# Patient Record
Sex: Male | Born: 1955 | Race: Black or African American | Hispanic: No | Marital: Single | Smoking: Current every day smoker
Health system: Southern US, Community
[De-identification: ages and names within clinical notes are randomized; demographics above are authoritative.]

## PROBLEM LIST (undated history)

## (undated) DIAGNOSIS — F1990 Other psychoactive substance use, unspecified, uncomplicated: Secondary | ICD-10-CM

## (undated) DIAGNOSIS — E7800 Pure hypercholesterolemia, unspecified: Secondary | ICD-10-CM

## (undated) DIAGNOSIS — I639 Cerebral infarction, unspecified: Secondary | ICD-10-CM

## (undated) DIAGNOSIS — I10 Essential (primary) hypertension: Secondary | ICD-10-CM

## (undated) DIAGNOSIS — F1010 Alcohol abuse, uncomplicated: Secondary | ICD-10-CM

## (undated) HISTORY — PX: NO PAST SURGERIES: SHX2092

---

## 2003-10-22 ENCOUNTER — Other Ambulatory Visit: Payer: Self-pay

## 2004-04-30 ENCOUNTER — Ambulatory Visit: Payer: Self-pay | Admitting: Unknown Physician Specialty

## 2010-07-27 ENCOUNTER — Ambulatory Visit: Payer: Self-pay

## 2013-09-02 ENCOUNTER — Inpatient Hospital Stay: Payer: Self-pay | Admitting: Internal Medicine

## 2013-09-02 ENCOUNTER — Ambulatory Visit: Payer: Self-pay | Admitting: Neurology

## 2013-09-02 DIAGNOSIS — I369 Nonrheumatic tricuspid valve disorder, unspecified: Secondary | ICD-10-CM

## 2013-09-02 LAB — MAGNESIUM: Magnesium: 1.9 mg/dL

## 2013-09-02 LAB — CBC
HCT: 53.8 % — ABNORMAL HIGH (ref 40.0–52.0)
HGB: 18 g/dL (ref 13.0–18.0)
MCH: 32.4 pg (ref 26.0–34.0)
MCHC: 33.6 g/dL (ref 32.0–36.0)
MCV: 97 fL (ref 80–100)
Platelet: 273 10*3/uL (ref 150–440)
RBC: 5.57 10*6/uL (ref 4.40–5.90)
RDW: 15.2 % — ABNORMAL HIGH (ref 11.5–14.5)
WBC: 11.5 10*3/uL — ABNORMAL HIGH (ref 3.8–10.6)

## 2013-09-02 LAB — DRUG SCREEN, URINE
Amphetamines, Ur Screen: NEGATIVE (ref ?–1000)
Barbiturates, Ur Screen: NEGATIVE (ref ?–200)
Benzodiazepine, Ur Scrn: NEGATIVE (ref ?–200)
Cannabinoid 50 Ng, Ur ~~LOC~~: POSITIVE (ref ?–50)
Cocaine Metabolite,Ur ~~LOC~~: NEGATIVE (ref ?–300)
MDMA (Ecstasy)Ur Screen: NEGATIVE (ref ?–500)
Methadone, Ur Screen: NEGATIVE (ref ?–300)
Opiate, Ur Screen: NEGATIVE (ref ?–300)
Phencyclidine (PCP) Ur S: NEGATIVE (ref ?–25)
TRICYCLIC, UR SCREEN: NEGATIVE (ref ?–1000)

## 2013-09-02 LAB — COMPREHENSIVE METABOLIC PANEL
ALBUMIN: 3.7 g/dL (ref 3.4–5.0)
ALK PHOS: 88 U/L
AST: 89 U/L — AB (ref 15–37)
Anion Gap: 8 (ref 7–16)
BUN: 7 mg/dL (ref 7–18)
Bilirubin,Total: 0.6 mg/dL (ref 0.2–1.0)
CALCIUM: 9 mg/dL (ref 8.5–10.1)
Chloride: 102 mmol/L (ref 98–107)
Co2: 21 mmol/L (ref 21–32)
Creatinine: 1.33 mg/dL — ABNORMAL HIGH (ref 0.60–1.30)
EGFR (African American): >60
EGFR (Non-African Amer.): 59 — ABNORMAL LOW
GLUCOSE: 109 mg/dL — AB (ref 65–99)
Osmolality: 261 (ref 275–301)
Potassium: 3.7 mmol/L (ref 3.5–5.1)
SGPT (ALT): 102 U/L — ABNORMAL HIGH (ref 12–78)
Sodium: 131 mmol/L — ABNORMAL LOW (ref 136–145)
TOTAL PROTEIN: 8.3 g/dL — AB (ref 6.4–8.2)

## 2013-09-02 LAB — TROPONIN I

## 2013-09-02 LAB — TSH: Thyroid Stimulating Horm: 5.51 u[IU]/mL — ABNORMAL HIGH

## 2013-09-02 LAB — PHOSPHORUS: Phosphorus: 2.3 mg/dL — ABNORMAL LOW (ref 2.5–4.9)

## 2013-09-02 LAB — ETHANOL

## 2013-09-02 LAB — LIPID PANEL
CHOLESTEROL: 157 mg/dL (ref 0–200)
HDL: 47 mg/dL (ref 40–60)
Ldl Cholesterol, Calc: 91 mg/dL (ref 0–100)
TRIGLYCERIDES: 96 mg/dL (ref 0–200)
VLDL CHOLESTEROL, CALC: 19 mg/dL (ref 5–40)

## 2013-09-02 LAB — APTT: Activated PTT: 33 secs (ref 23.6–35.9)

## 2013-09-03 LAB — BASIC METABOLIC PANEL
Anion Gap: 12 (ref 7–16)
BUN: 10 mg/dL (ref 7–18)
CHLORIDE: 99 mmol/L (ref 98–107)
CREATININE: 1.22 mg/dL (ref 0.60–1.30)
Calcium, Total: 9.5 mg/dL (ref 8.5–10.1)
Co2: 20 mmol/L — ABNORMAL LOW (ref 21–32)
EGFR (Non-African Amer.): >60
GLUCOSE: 90 mg/dL (ref 65–99)
Osmolality: 261 (ref 275–301)
Potassium: 4.3 mmol/L (ref 3.5–5.1)
Sodium: 131 mmol/L — ABNORMAL LOW (ref 136–145)

## 2013-09-03 LAB — MAGNESIUM: MAGNESIUM: 2.3 mg/dL

## 2013-09-05 LAB — BASIC METABOLIC PANEL
Anion Gap: 7 (ref 7–16)
BUN: 17 mg/dL (ref 7–18)
CALCIUM: 9.2 mg/dL (ref 8.5–10.1)
CO2: 26 mmol/L (ref 21–32)
Chloride: 100 mmol/L (ref 98–107)
Creatinine: 1.62 mg/dL — ABNORMAL HIGH (ref 0.60–1.30)
EGFR (African American): 54 — ABNORMAL LOW
EGFR (Non-African Amer.): 46 — ABNORMAL LOW
GLUCOSE: 87 mg/dL (ref 65–99)
Osmolality: 267 (ref 275–301)
Potassium: 4 mmol/L (ref 3.5–5.1)
Sodium: 133 mmol/L — ABNORMAL LOW (ref 136–145)

## 2013-09-06 LAB — BASIC METABOLIC PANEL
ANION GAP: 8 (ref 7–16)
BUN: 18 mg/dL (ref 7–18)
Calcium, Total: 8.9 mg/dL (ref 8.5–10.1)
Chloride: 103 mmol/L (ref 98–107)
Co2: 23 mmol/L (ref 21–32)
Creatinine: 1.37 mg/dL — ABNORMAL HIGH (ref 0.60–1.30)
EGFR (African American): >60
EGFR (Non-African Amer.): 57 — ABNORMAL LOW
GLUCOSE: 92 mg/dL (ref 65–99)
Osmolality: 270 (ref 275–301)
POTASSIUM: 3.9 mmol/L (ref 3.5–5.1)
Sodium: 134 mmol/L — ABNORMAL LOW (ref 136–145)

## 2014-02-27 ENCOUNTER — Ambulatory Visit: Payer: Self-pay

## 2014-07-13 NOTE — H&P (Signed)
PATIENT NAME:  Melvin Miles, Melvin Miles MR#:  [id number] DATE OF BIRTH:  11-03-1955  DATE OF ADMISSION:  09/02/2013  CHIEF COMPLAINT: Unsteady gait for 2 to 3 days.   HISTORY OF PRESENT ILLNESS: Melvin Miles is a 59 year old gentleman with no significant past medical history, comes in the Emergency Room after he started noticing very unsteady gait at home. He is having significant problem balancing and unable to walk around without any help. He has been helped at home by his brother. It happened all of a sudden about 2 to 3 days ago. Denies any fall or any injury. Denies any history of stroke in the past. He has received aspirin. In Emergency Room he was noted to have blood pressure of 212/102 in the Emergency Room on arrival. He received some IV labetalol. He is being admitted for uncontrolled hypertension/accelerated hypertension possible cerebrovascular accident, ataxia/unsteady gait. CT head showed cerebral atrophy. No acute stroke was identified.   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 No treatment was given and the patient is not following any neurologist at this time. He does not know any details about it.   FAMILY HISTORY: Positive for hypertension.   SOCIAL HISTORY: He smokes on a daily basis. Drinks a couple of beers every day. He denies any history of DTs. Denies any other drug use.    MEDICATIONS: None.   REVIEW OF SYSTEMS:  CONSTITUTIONAL: No fever, fatigue, weakness.  EYES: No blurred or double vision, glaucoma or cataracts.  ENT: No tinnitus, ear pain, hearing loss.  RESPIRATORY: No cough, wheeze, hemoptysis, shortness of breath.  CARDIOVASCULAR: No chest pain, orthopnea, edema.  GASTROINTESTINAL: No nausea, vomiting, diarrhea, or abdominal pain.  GENITOURINARY: No dysuria or hematuria.  ENDOCRINE: No polyuria, nocturia or thyroid problems.   HEMATOLOGY: No anemia or easy bruising.  SKIN: No acne,  rash.  MUSCULOSKELETAL: Positive for back pain.  NEUROLOGIC: No CVA, transient ischemic attack, no dysarthria, dementia, or vertigo. Positive for unsteady gait and ataxia.  PSYCHIATRIC: No anxiety or depression. All other systems reviewed and negative.   PHYSICAL EXAMINATION: GENERAL: The patient is awake, alert, oriented x 3, not in acute distress, afebrile, pulse 52, blood pressure 178/84, sats are 98% on room air.  HEENT: Atraumatic, normocephalic. Pupils PERRLA, EOMI intact. Oral mucosa is moist.  NECK: Supple. No JVD. No carotid bruit.  LUNGS: Clear to auscultation bilaterally. No rales, rhonchi, respiratory distress or labored breathing.  HEART: Both the heart sounds are normal. Rate, rhythm regular. PMI not lateralized. Chest is nontender, good pedal pulses, good femoral pulses. No lower extremity edema.  ABDOMEN: Soft, benign, nontender. No organomegaly. Positive bowel sounds.  NEUROLOGIC: Grossly intact cranial nerves II through XII. No motor or sensory deficit. The patient does have ataxic gait. Positive Romberg sign.  The patient has difficulty with  test of coordination in both lower extremities. No nystagmus noted. Speech clear.  SKIN: Warm and dry.  PSYCHIATRIC: The patient is awake, alert, oriented x 3.   Ultrasound of the Doppler shows patent vertebral vessels. Mild atherosclerotic disease in the left internal carotid artery estimated degree of stenosis is less than 50% bilaterally. 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 Cortical lung wall prominence of ventricular sulci suggestive of mild cortical volume loss. Diffuse small vessel ischemic microangiopathy. CBC within normal limits. Carotid markers normal. Creatinine is 1.33. Glucose is 109, sodium  is 131. SGPT 102. SGOT is 89. Total protein is 8.3. EKG shows sinus rhythm. No acute ST elevation or depression.   ASSESSMENT AND PLAN: A 59 year old Melvin Miles with  no significant past medical history comes in with:  1. Unsteady gait and ataxia. The patient will be admitted on telemetry floor. Rule out cerebrovascular accident. He has history of cerebellar infarct with cerebellar atrophy in the past and ongoing history of alcohol use. We will continue aspirin for now.  Get ultrasound Doppler of carotid, echo of the  heart and MRI of the brain. Physical therapy to see the patient. Neuro consultation for further evaluation and assessment for unsteady gait/ataxia.  2. Uncontrolled/accelerated hypertension. The patient came in with blood pressure 221/102. Received IV labetalol. We will give him p.o. Toprol-XL and hydrochlorothiazide and put him on p.r.n. hydralazine and adjust blood pressure meds according to blood pressure  3. Elevated LFTs, likely secondary to alcohol abuse. Check serum alcohol level. The patient advised on alcohol abstinence. We will continue to monitor LFTs.  4. Clinical dehydration with elevated creatinine and low sodium. 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 We will give him some IV fluids and see if the labs  improve with that.  5. Deep vein thrombosis prophylaxis. Subcutaneous heparin.  6. Alcohol abuse. We will put him on CIWA protocol with IV Ativan.  7. Smoking cessation. Counseling done about 3 to 4 minutes spent. The patient was advised on smoking cessation. He did voice understanding.   Further work-up according to the patient's clinical course. Hospital admission plan was discussed with the patient, the patient's brother was present in the Emergency Room.   TIME SPENT: 55 minutes.    ____________________________ Hart Rochester Posey Pronto, MD sap:sg D: 09/02/2013 11:05:00 ET T: 09/02/2013 11:23:11 ET JOB#: [id number]  cc: Everly Rubalcava A. Posey Pronto, MD, <Dictator> Ilda Basset MD ELECTRONICALLY SIGNED 09/03/2013 14:21

## 2014-07-13 NOTE — Consult Note (Signed)
PATIENT NAME:  Melvin Miles, Melvin Miles MR#:  [id number] DATE OF BIRTH:  1955-05-10  DATE OF CONSULTATION:  09/02/2013  REFERRING PHYSICIAN:   CONSULTING PHYSICIAN:  Leotis Pain, MD  REASON FOR CONSULTATION:  Ataxia.  HISTORY OF PRESENT ILLNESS: This is a 59 year old gentleman with past medical history of EtOH use that is daily. The patient states he drinks 40 of beer daily, presents with a 2 to 3-day history of ataxic gait. The patient has difficulty ambulating. No history of stroke in the past. On admission, the patient was found to be hypertensive with systolic blood pressure 578/469. The patient is admitted for further workup of unsteady gait. CT of the head did not show any acute intracranial abnormalities, it showed generalized atrophy consistent with chronic EtOH abuse.    PAST MEDICAL HISTORY: Questionable history of tumor at the age of 54, not seen on the CAT scan right now.   FAMILY HISTORY: Positive for hypertension.   SOCIAL HISTORY: Smokes daily and drinks at least 40 ounces a day. Denies any drug use.   MEDICATIONS: None.   LABORATORY, DIAGNOSTIC AND RADIOLOGICAL DATA:  Laboratory workup has been reviewed. CAT scan as described above.   REVIEW OF SYSTEMS:  CONSTITUTIONAL:  No fever. No fatigue.  EYES:  No double vision. No glaucoma no cataracts.  ENT:  No ear pain. No hearing loss.  RESPIRATORY:  No cough. No wheeze. No hemoptysis. No chest pain, no orthopnea or edema.   GASTROINTESTINAL:  No nausea, vomiting, diarrhea.  ENDOCRINE:  No polyuria or dysuria. Thyroid problems.  NEUROLOGICAL: Positive for ataxic gait.  PSYCHIATRIC: No anxiety or depression.   PHYSICAL EXAMINATION: VITAL SIGNS: Include a temperature of 97.4, blood pressure 167/87.  NEUROLOGIC: The patient was able to tell me his name and that he is in the hospital, could not tell me the date, time. Speech appears to be fluent but slow. No signs of dysarthria or aphasia noted. Remote memory is severely  impaired. Extraocular movements are intact. Visual fields appear to be intact but slower than I would expect for his age. Sensation intact. Facial motor intact. Tongue is midline. Uvula elevates symmetrically. Shoulder shrug intact. Motor strength appears to be 5/5 bilaterally upper and lower extremities. Coordination very tremulous and dysmetria bilaterally upper and lower extremities on heel-to-shin and on finger-to-nose.  Gait  attempted but could not be assessed due to ataxia.   IMPRESSION: This is a 59 year old gentleman with no real past medical history on file except chronic ethyl alcohol use daily, at least 40 ounces of beer, presenting with 3-day history of  ataxic gait. On further examination, it does appear the patient has confabulations and at times  visual fields are affected but no other ophthalmoplegia has been noted. On coronary  examination, his symptoms are more related towards Wernicke's encephalopathy rather than a stroke. Strokes should not produce severe dysmetria bilaterally, upper and lower extremities.   PLAN: The patient is on thiamine and his thiamine was changed from 50 mg daily to 500 mg IV daily. Please obtain vitamin B level to rule out pernicious anemia, which could also contribute to current symptoms. Continue thiamine 500 mg IV for 3 days, then switch it to 100 mg q.8 hours, which can be done p.o. after which, finishing the course of another 3 days, the patient should be on 100 mg daily. Continue the folic acid and multivitamin. I do not think this patient was taking vitamins or good nutrition.  It takes about 6 months to deplete the  thiamine stores. Imaging consistent with generalized atrophy of his brain, which could be consistent with chronic EtOH use. Do not think the patient needs an MRI at this point.   Thank you. It was a pleasure seeing this patient. Please call with any questions.  ____________________________ Leotis Pain, MD yz:cs D: 09/02/2013 15:54:47  ET T: 09/02/2013 18:29:53 ET JOB#: [id number]  cc: Leotis Pain, MD, <Dictator> Leotis Pain MD ELECTRONICALLY SIGNED 09/06/2013 17:21

## 2014-07-13 NOTE — Discharge Summary (Signed)
PATIENT NAME:  Melvin Miles, Melvin Miles MR#:  [id number] DATE OF BIRTH:  [birth year]  DATE OF ADMISSION:  09/02/2013 DATE OF DISCHARGE:    PRIMARY CARE PHYSICIAN: Dr. Lovie Macadamia.  FINAL DIAGNOSES: Acute cerebrovascular accident with ataxia, Wernicle encephalopathy, hypertension, alcohol abuse and chronic kidney disease.   CONDITION: Stable.   CODE STATUS: Full code.   HOME MEDICATIONS: Aspirin 81 mg p.o. daily; Zocor 20 mg p.o. at bedtime; Norvasc 5 mg p.o. daily and folic acid 1 mg p.o. daily.   DIET: Low sodium diet.   ACTIVITY: As tolerated.   FOLLOWUP CARE: Follow up with PCP within 1 to 2 weeks.   REASON FOR ADMISSION: Unsteady gait for 2 to 3 days.   HOSPITAL COURSE: The patient is a 59 year old, African American male with no past medical history, who came to ED for unsteady gait at home. He had significant problem balancing and unable to walk around without any help. The patient's blood pressure was high at 212/102 in the ED. He received IV labetalol and admitted for ataxia. The patient's CAT scan of head showed cerebral atrophy, but no acute stroke was identified. For detailed history and physical examination, please refer to the admission noted dictated by Dr. Fritzi Mandes.  For his unsteady gait and ataxia, after admission, the patient got an MRI of the brain, which showed acute lacunar infarct. Unsteady gait and ataxia is possibly due to acute CVA. The patient got  an aspirin and Zocor treatment. In addition, the patient got a physical therapy evaluation. The patient's echocardiogram did not show any source of CVA. No significant and carotid stenosis. According to Dr. Irish Elders, the patient may also have aWernicle encephalopathy. The patient was treated with thiamine and folic acid. According to physical therapy evaluation, the patient needs skilled nursing facility. The patient's blood pressure is stable. The patient still has ataxia. He will be discharged to skilled nursing facility today.  I discussed the patient's discharge plan with the patient, nurse and social worker.  Time spent: 37 minutes. ____________________________ Demetrios Loll, MD qc:aw D: 09/06/2013 11:19:14 ET T: 09/06/2013 11:30:23 ET JOB#: [id number]  cc: Demetrios Loll, MD, <Dictator> Demetrios Loll MD ELECTRONICALLY SIGNED 09/06/2013 12:32

## 2015-01-10 ENCOUNTER — Encounter: Payer: Self-pay | Admitting: *Deleted

## 2015-01-13 ENCOUNTER — Encounter: Admission: RE | Payer: Self-pay | Source: Ambulatory Visit

## 2015-01-13 ENCOUNTER — Ambulatory Visit
Admission: RE | Admit: 2015-01-13 | Payer: Medicaid Other | Source: Ambulatory Visit | Admitting: Unknown Physician Specialty

## 2015-01-13 HISTORY — DX: Essential (primary) hypertension: I10

## 2015-01-13 HISTORY — DX: Alcohol abuse, uncomplicated: F10.10

## 2015-01-13 HISTORY — DX: Cerebral infarction, unspecified: I63.9

## 2015-01-13 HISTORY — DX: Other psychoactive substance use, unspecified, uncomplicated: F19.90

## 2015-01-13 HISTORY — DX: Pure hypercholesterolemia, unspecified: E78.00

## 2015-01-13 SURGERY — COLONOSCOPY WITH PROPOFOL
Anesthesia: General

## 2015-02-21 ENCOUNTER — Ambulatory Visit: Admit: 2015-02-21 | Payer: Medicaid Other | Admitting: Unknown Physician Specialty

## 2015-02-21 SURGERY — COLONOSCOPY WITH PROPOFOL
Anesthesia: General

## 2015-03-05 ENCOUNTER — Encounter: Payer: Self-pay | Admitting: *Deleted

## 2015-04-09 ENCOUNTER — Encounter: Payer: Self-pay | Admitting: *Deleted

## 2015-04-09 ENCOUNTER — Ambulatory Visit: Payer: Medicaid Other | Admitting: Anesthesiology

## 2015-04-09 ENCOUNTER — Ambulatory Visit
Admission: RE | Admit: 2015-04-09 | Discharge: 2015-04-09 | Disposition: A | Discharge status: Home / Self Care | Payer: Medicaid Other | Source: Ambulatory Visit | Attending: Unknown Physician Specialty | Admitting: Unknown Physician Specialty

## 2015-04-09 ENCOUNTER — Encounter
Admission: RE | Disposition: A | Discharge status: Home / Self Care | Payer: Self-pay | Source: Ambulatory Visit | Attending: Unknown Physician Specialty

## 2015-04-09 DIAGNOSIS — K640 First degree hemorrhoids: Secondary | ICD-10-CM | POA: Diagnosis not present

## 2015-04-09 DIAGNOSIS — F1010 Alcohol abuse, uncomplicated: Secondary | ICD-10-CM | POA: Insufficient documentation

## 2015-04-09 DIAGNOSIS — F17200 Nicotine dependence, unspecified, uncomplicated: Secondary | ICD-10-CM | POA: Insufficient documentation

## 2015-04-09 DIAGNOSIS — D123 Benign neoplasm of transverse colon: Secondary | ICD-10-CM | POA: Insufficient documentation

## 2015-04-09 DIAGNOSIS — I10 Essential (primary) hypertension: Secondary | ICD-10-CM | POA: Insufficient documentation

## 2015-04-09 DIAGNOSIS — Z8371 Family history of colonic polyps: Secondary | ICD-10-CM | POA: Diagnosis not present

## 2015-04-09 DIAGNOSIS — Z7982 Long term (current) use of aspirin: Secondary | ICD-10-CM | POA: Diagnosis not present

## 2015-04-09 DIAGNOSIS — D125 Benign neoplasm of sigmoid colon: Secondary | ICD-10-CM | POA: Diagnosis not present

## 2015-04-09 DIAGNOSIS — Z79899 Other long term (current) drug therapy: Secondary | ICD-10-CM | POA: Insufficient documentation

## 2015-04-09 DIAGNOSIS — D122 Benign neoplasm of ascending colon: Secondary | ICD-10-CM | POA: Diagnosis not present

## 2015-04-09 DIAGNOSIS — K621 Rectal polyp: Secondary | ICD-10-CM | POA: Insufficient documentation

## 2015-04-09 DIAGNOSIS — Z1211 Encounter for screening for malignant neoplasm of colon: Secondary | ICD-10-CM | POA: Diagnosis not present

## 2015-04-09 DIAGNOSIS — E7800 Pure hypercholesterolemia, unspecified: Secondary | ICD-10-CM | POA: Diagnosis not present

## 2015-04-09 DIAGNOSIS — Z800 Family history of malignant neoplasm of digestive organs: Secondary | ICD-10-CM | POA: Diagnosis not present

## 2015-04-09 DIAGNOSIS — Z8673 Personal history of transient ischemic attack (TIA), and cerebral infarction without residual deficits: Secondary | ICD-10-CM | POA: Diagnosis not present

## 2015-04-09 HISTORY — PX: COLONOSCOPY WITH PROPOFOL: SHX5780

## 2015-04-09 LAB — HM COLONOSCOPY

## 2015-04-09 SURGERY — COLONOSCOPY WITH PROPOFOL
Anesthesia: General

## 2015-04-09 MED ORDER — PHENYLEPHRINE HCL 10 MG/ML IJ SOLN
INTRAMUSCULAR | Status: DC | PRN
  Administered 2015-04-09 (×2): 100 ug via INTRAVENOUS

## 2015-04-09 MED ORDER — SODIUM CHLORIDE 0.9 % IV SOLN
INTRAVENOUS | Status: DC
  Administered 2015-04-09: 09:00:00 via INTRAVENOUS

## 2015-04-09 MED ORDER — SODIUM CHLORIDE 0.9 % IV SOLN: INTRAVENOUS | Status: DC

## 2015-04-09 MED ORDER — PROPOFOL 10 MG/ML IV BOLUS
INTRAVENOUS | Status: DC | PRN
  Administered 2015-04-09 (×14): 20 mg via INTRAVENOUS

## 2015-04-09 NOTE — H&P (Signed)
   Primary Care Physician:  Loistine Chance, MD Primary Gastroenterologist:  Dr. Vira Agar  Pre-Procedure History & Physical: HPI:  Melvin Miles is a 60 y.o. male is here for an colonoscopy.   Past Medical History  Diagnosis Date  . Alcohol abuse   . Drug use   . Hypertension   . Stroke ([place])   . Hypercholesterolemia     Past Surgical History  Procedure Laterality Date  . No past surgeries      Prior to Admission medications   Medication Sig Start Date End Date Taking? Authorizing Provider  amLODipine (NORVASC) 10 MG tablet Take 10 mg by mouth daily.    Historical Provider, MD  aspirin 81 MG tablet Take 81 mg by mouth daily.    Historical Provider, MD  atorvastatin (LIPITOR) 80 MG tablet Take 80 mg by mouth daily.    Historical Provider, MD  folic acid (FOLVITE) 1 MG tablet Take 1 mg by mouth daily.    Historical Provider, MD  lisinopril (PRINIVIL,ZESTRIL) 20 MG tablet Take 20 mg by mouth daily.    Historical Provider, MD  oxyCODONE (ROXICODONE) 15 MG immediate release tablet Take 15 mg by mouth every 4 (four) hours as needed for pain.    Historical Provider, MD    Allergies as of 02/20/2015  . (No Known Allergies)    History reviewed. No pertinent family history.  Social History   Social History  . Marital Status: Single    Spouse Name: N/A  . Number of Children: N/A  . Years of Education: N/A   Occupational History  . Not on file.   Social History Main Topics  . Smoking status: Current Every Day Smoker  . Smokeless tobacco: Not on file  . Alcohol Use: Not on file  . Drug Use: Not on file  . Sexual Activity: Not on file   Other Topics Concern  . Not on file   Social History Narrative    Review of Systems: See HPI, otherwise negative ROS  Physical Exam: BP 111/71 mmHg  Pulse 82  Temp(Src) 97.9 F (36.6 C) (Tympanic)  Resp 16  Ht 5\' 4"  (1.626 m)  Wt 63.504 kg (140 lb)  BMI 24.02 kg/m2  SpO2 99% General:   Alert,  pleasant and cooperative  in NAD Head:  Normocephalic and atraumatic. Neck:  Supple; no masses or thyromegaly. Lungs:  Clear throughout to auscultation.    Heart:  Regular rate and rhythm. Abdomen:  Soft, nontender and nondistended. Normal bowel sounds, without guarding, and without rebound.   Neurologic:  Alert and  oriented x4;  grossly normal neurologically.  Impression/Plan: Melvin Miles is here for an colonoscopy to be performed for screening due to [place] colon cancer.  Risks, benefits, limitations, and alternatives regarding  colonoscopy have been reviewed with the patient.  Questions have been answered.  All parties agreeable.   [name], MD  04/09/2015, 8:44 AM

## 2015-04-09 NOTE — Op Note (Signed)
[facility] Gastroenterology Patient Name: Melvin Miles Procedure Date: 04/09/2015 8:51 AM MRN: [id number] Account #: [id number] Date of Birth: 06-16-1955 Admit Type: Outpatient Age: 60 Room: [facility] ENDO ROOM 4 Gender: Male Note Status: Finalized Procedure:         Colonoscopy Indications:       Screening in patient at increased risk: Family history of                     1st-degree relative with colorectal cancer Providers:         Manya Silvas, MD Referring MD:      Bethena Roys. Sowles, MD (Referring MD) Medicines:         Propofol per Anesthesia Complications:     No immediate complications. Procedure:         Pre-Anesthesia Assessment:                    - After reviewing the risks and benefits, the patient was                     deemed in satisfactory condition to undergo the procedure.                    After obtaining informed consent, the colonoscope was                     passed under direct vision. Throughout the procedure, the                     patient's blood pressure, pulse, and oxygen saturations                     were monitored continuously. The Olympus PCF-H180AL                     colonoscope ( S#: [id number] ) was introduced through the                     anus and advanced to the the cecum, identified by                     appendiceal orifice and ileocecal valve. The colonoscopy                     was performed without difficulty. The patient tolerated                     the procedure well. The quality of the bowel preparation                     was excellent. Findings:      Multiple sessile polyps were found in the rectum, in the sigmoid colon,       in the transverse colon and in the ascending colon. The polyps were       small in size. These polyps were removed with a hot snare. Resection and       retrieval were complete. One clip placed in ascending colon site. One       polyp removed with cold forceps.      Internal  hemorrhoids were found during endoscopy. The hemorrhoids were       small and Grade I (internal hemorrhoids that do not prolapse). Impression:        - Multiple small polyps in  the rectum, in the sigmoid                     colon, in the transverse colon and in the ascending colon.                     Resected and retrieved.                    - Internal hemorrhoids. Recommendation:    - Await pathology results. Manya Silvas, MD 04/09/2015 9:35:33 AM This report has been signed electronically. Number of Addenda: 0 Note Initiated On: 04/09/2015 8:51 AM Scope Withdrawal Time: 0 hours 20 minutes 4 seconds  Total Procedure Duration: 0 hours 32 minutes 2 seconds       [facility]

## 2015-04-09 NOTE — Transfer of Care (Signed)
Immediate Anesthesia Transfer of Care Note  Patient: Melvin Miles  Procedure(s) Performed: Procedure(s): COLONOSCOPY WITH PROPOFOL (N/A)  Patient Location: PACU and Endoscopy Unit  Anesthesia Type:General  Level of Consciousness: alert  and oriented  Airway & Oxygen Therapy: Patient Spontanous Breathing and Patient connected to nasal cannula oxygen  Post-op Assessment: Report given to RN and Post -op Vital signs reviewed and stable  Post vital signs: stable  Last Vitals:  Filed Vitals:   04/09/15 0927 04/09/15 0928  BP:    Pulse: 77 81  Temp:    Resp: 0 0    Complications: No apparent anesthesia complications

## 2015-04-09 NOTE — Anesthesia Preprocedure Evaluation (Signed)
Anesthesia Evaluation  Patient identified by MRN, date of birth, ID band Patient awake    Reviewed: Allergy & Precautions, H&P , NPO status , Patient's Chart, lab work & pertinent test results, reviewed documented beta blocker date and time   Airway Mallampati: II   Neck ROM: full    Dental  (+) Poor Dentition   Pulmonary neg pulmonary ROS, Current Smoker,    Pulmonary exam normal        Cardiovascular hypertension, negative cardio ROS Normal cardiovascular exam     Neuro/Psych CVA, Residual Symptoms negative neurological ROS  negative psych ROS   GI/Hepatic negative GI ROS, Neg liver ROS,   Endo/Other  negative endocrine ROS  Renal/GU negative Renal ROS  negative genitourinary   Musculoskeletal   Abdominal   Peds  Hematology negative hematology ROS (+)   Anesthesia Other Findings Past Medical History:   Alcohol abuse                                                Drug use                                                     Hypertension                                                 Stroke ([place])                                                 Hypercholesterolemia                                       Past Surgical History:   NO PAST SURGERIES                                           BMI    Body Mass Index   24.01 kg/m 2     Reproductive/Obstetrics                             Anesthesia Physical Anesthesia Plan  ASA: III  Anesthesia Plan: General   Post-op Pain Management:    Induction:   Airway Management Planned:   Additional Equipment:   Intra-op Plan:   Post-operative Plan:   Informed Consent: I have reviewed the patients History and Physical, chart, labs and discussed the procedure including the risks, benefits and alternatives for the proposed anesthesia with the patient or authorized representative who has indicated his/her understanding and acceptance.   Dental  Advisory Given  Plan Discussed with: CRNA  Anesthesia Plan Comments:         Anesthesia Quick Evaluation

## 2015-04-09 NOTE — Anesthesia Postprocedure Evaluation (Signed)
Anesthesia Post Note  Patient: Melvin Miles  Procedure(s) Performed: Procedure(s) (LRB): COLONOSCOPY WITH PROPOFOL (N/A)  Patient location during evaluation: PACU Anesthesia Type: General Level of consciousness: awake and alert Pain management: pain level controlled Vital Signs Assessment: post-procedure vital signs reviewed and stable Respiratory status: spontaneous breathing, nonlabored ventilation, respiratory function stable and patient connected to nasal cannula oxygen Cardiovascular status: blood pressure returned to baseline and stable Postop Assessment: no signs of nausea or vomiting Anesthetic complications: no    Last Vitals:  Filed Vitals:   04/09/15 0927 04/09/15 0928  BP:    Pulse: 77 81  Temp:    Resp: 0 0    Last Pain: There were no vitals filed for this visit.               Molli Barrows

## 2015-04-11 LAB — SURGICAL PATHOLOGY

## 2015-07-02 IMAGING — CT CT HEAD WITHOUT CONTRAST
1 series · 15 of 30 positions shown, 19 images · non-contrast
Comparison: None.

CLINICAL DATA: Sudden onset of weakness and loss of balance. Right
arm drift and perseveration of speech.

EXAM:
CT HEAD WITHOUT CONTRAST
TECHNIQUE: Contiguous axial images were obtained from the base of the skull
through the vertex without intravenous contrast.

[Series 2: head wo · axial · 0.39mm/px · z∈[-21,+105]mm · 15 of 32 slices shown, 19 images]
[im 2/32  brain]
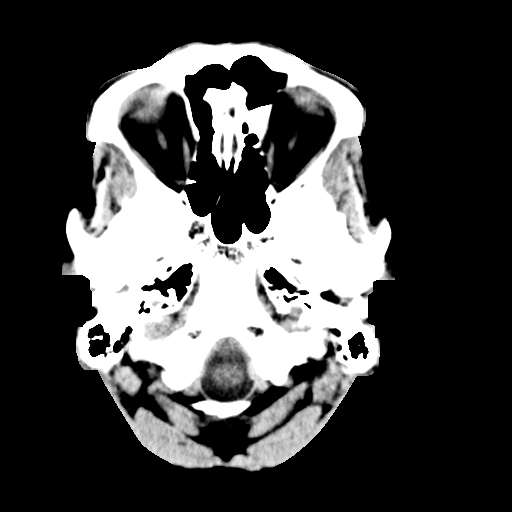
[im 2/32  bone]
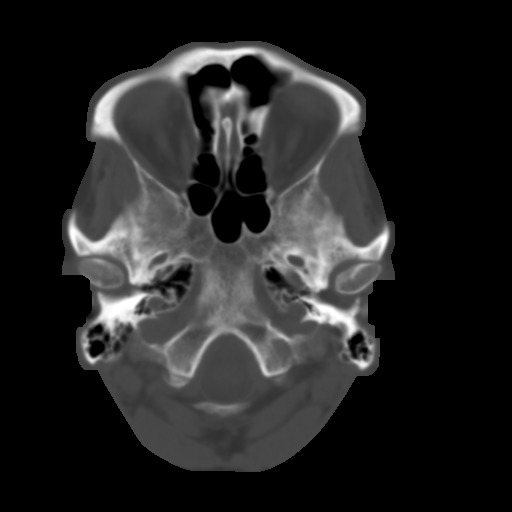
[im 4/32  brain]
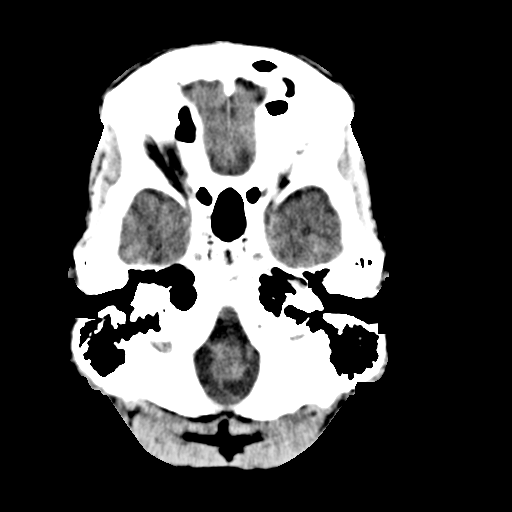
[im 6/32  brain]
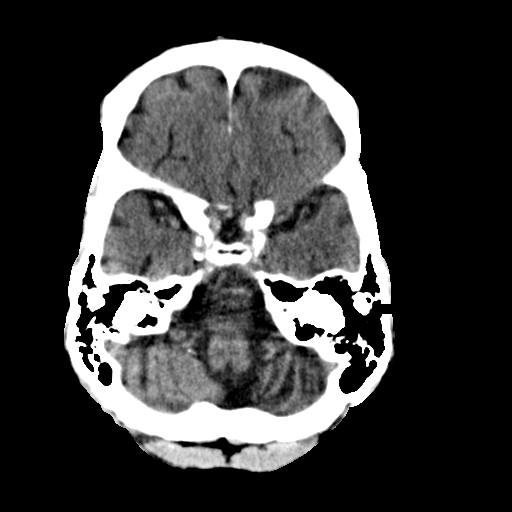
[im 8/32  brain]
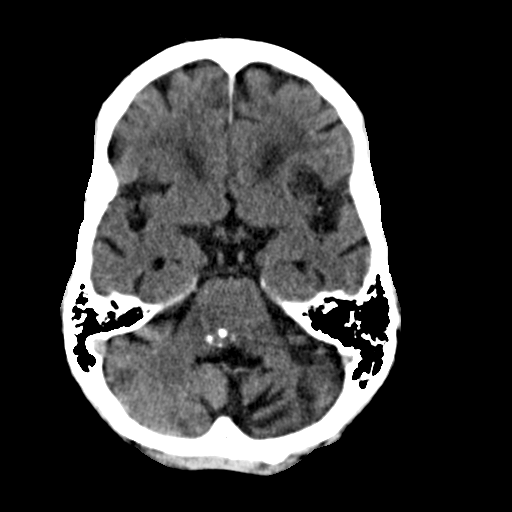
[im 10/32  brain]
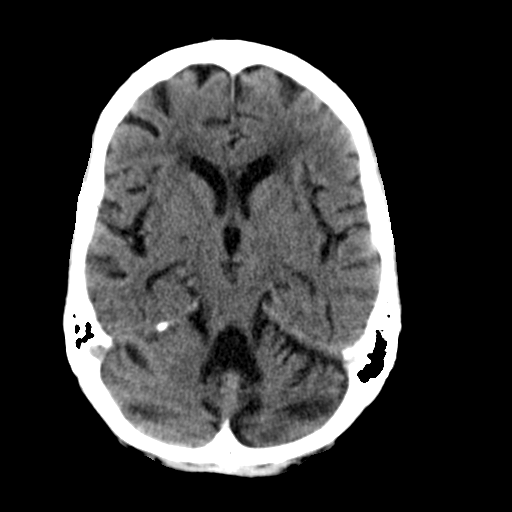
[im 10/32  bone]
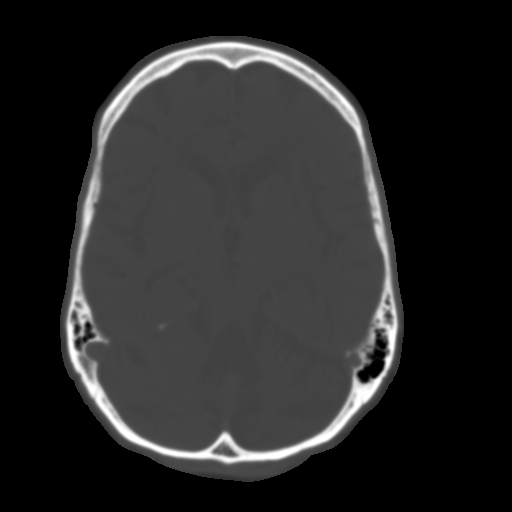
[im 12/32  brain]
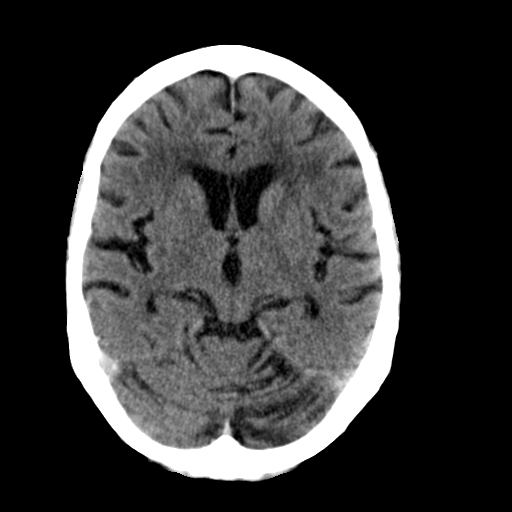
[im 14/32  brain]
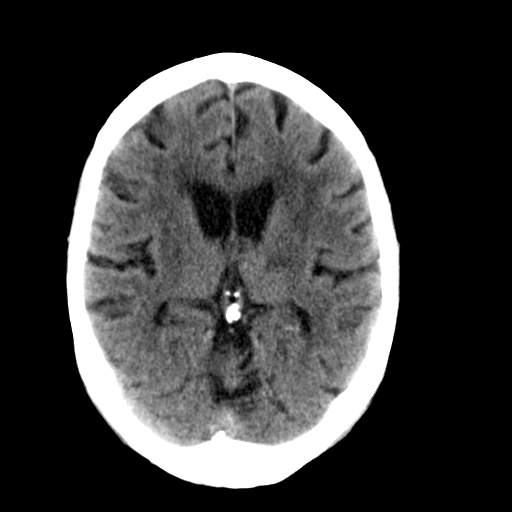
[im 17/32  brain]
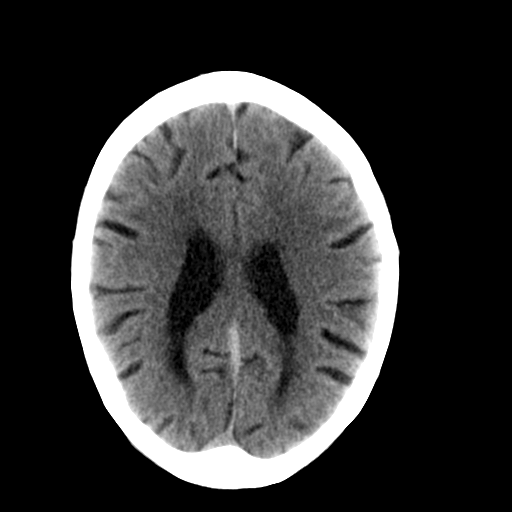
[im 18/32  brain]
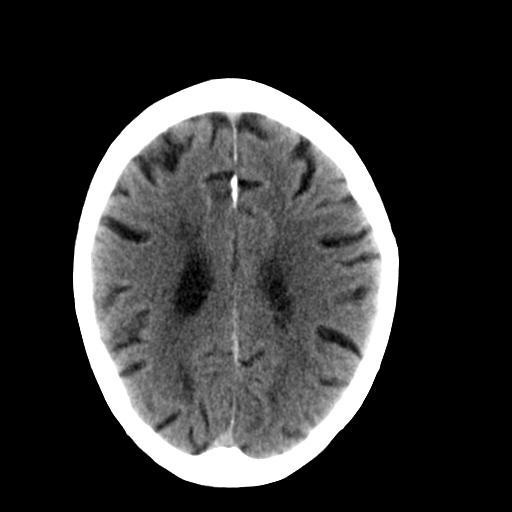
[im 18/32  bone]
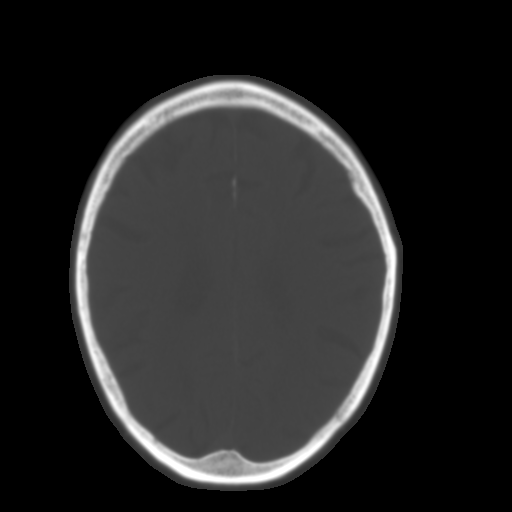
[im 20/32  brain]
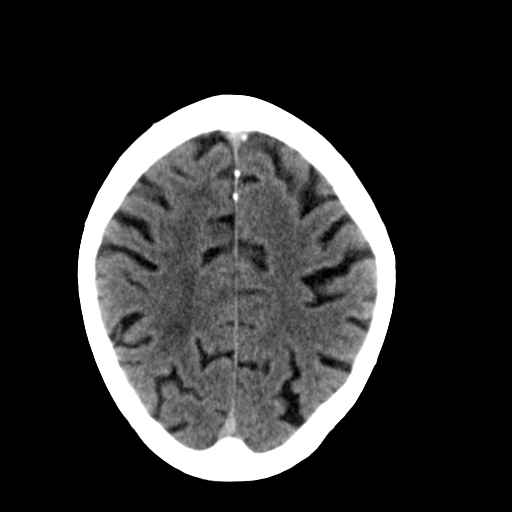
[im 22/32  brain]
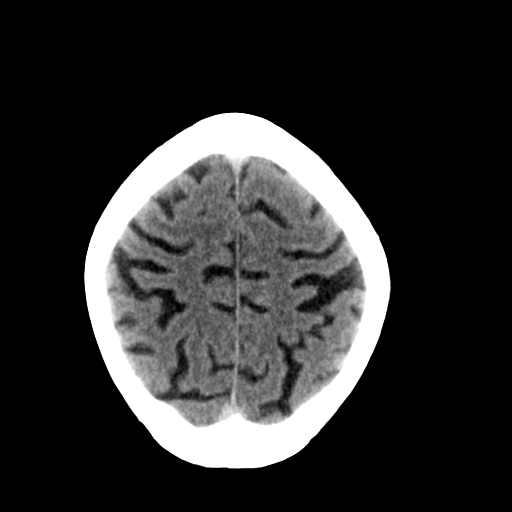
[im 24/32  brain]
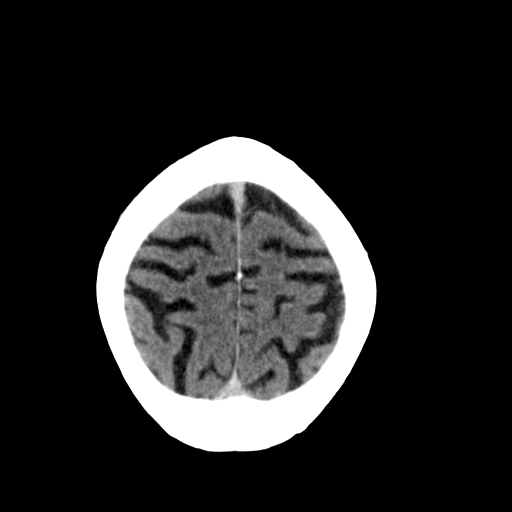
[im 26/32  brain]
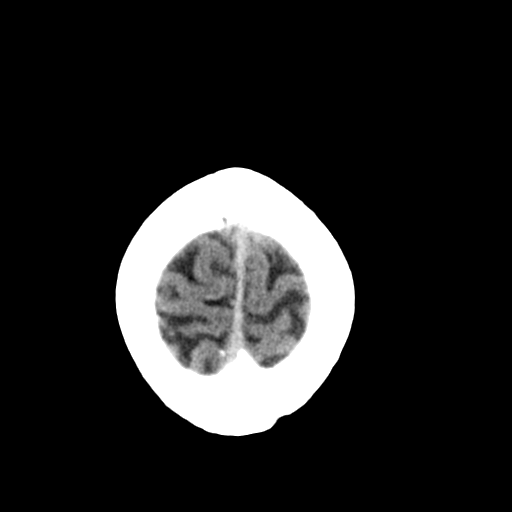
[im 26/32  bone]
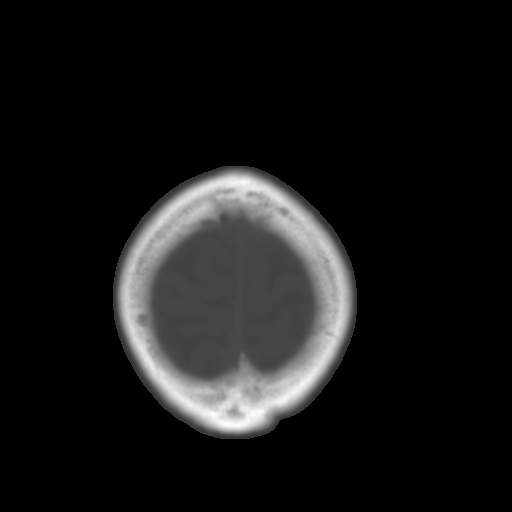
[im 28/32  brain]
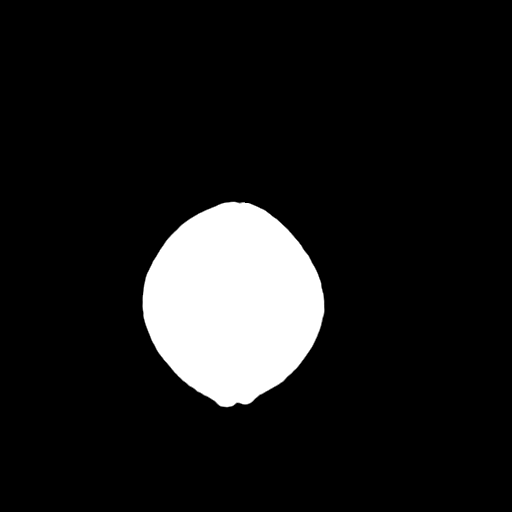
[im 30/32  brain]
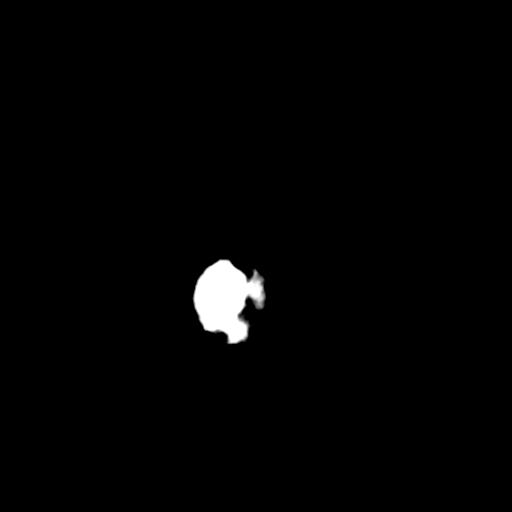

[15 of 30 positions shown; findings below may reference images not displayed]

FINDINGS: There is no evidence of acute infarction, mass lesion, or intra- or
extra-axial hemorrhage on CT.

Prominence of the ventricles and sulci suggest mild cortical volume
loss. Cerebellar atrophy is noted, with a chronic infarct involving
the left cerebellar hemisphere. Nonspecific calcification is noted
at the brainstem. Diffuse periventricular and subcortical white
matter change likely reflects small vessel ischemic microangiopathy.

The fourth ventricle is within normal limits. The basal ganglia are
unremarkable in appearance. The cerebral hemispheres demonstrate
grossly normal gray-white differentiation. No mass effect or midline
shift is seen.

There is no evidence of fracture; visualized osseous structures are
unremarkable in appearance. The visualized portions of the orbits
are within normal limits. The paranasal sinuses and mastoid air
cells are well-aerated. No significant soft tissue abnormalities are
seen.
IMPRESSION: 1. No acute intracranial pathology seen on CT.
2. Mild cortical volume loss and cerebellar atrophy noted. Chronic
infarct involving the left cerebellar hemisphere.
3. Nonspecific calcification noted at the brainstem.
4. Diffuse small vessel ischemic microangiopathy.

## 2015-08-14 ENCOUNTER — Other Ambulatory Visit: Payer: Self-pay | Admitting: Family Medicine

## 2015-08-14 DIAGNOSIS — B1920 Unspecified viral hepatitis C without hepatic coma: Secondary | ICD-10-CM

## 2015-08-22 ENCOUNTER — Ambulatory Visit: Payer: Medicaid Other

## 2015-09-03 ENCOUNTER — Ambulatory Visit: Admission: RE | Admit: 2015-09-03 | Payer: Medicaid Other | Source: Ambulatory Visit

## 2015-09-05 ENCOUNTER — Ambulatory Visit
Admission: RE | Admit: 2015-09-05 | Discharge: 2015-09-05 | Disposition: A | Discharge status: Home / Self Care | Payer: Medicaid Other | Source: Ambulatory Visit | Attending: Family Medicine | Admitting: Family Medicine

## 2015-09-05 DIAGNOSIS — B1920 Unspecified viral hepatitis C without hepatic coma: Secondary | ICD-10-CM | POA: Diagnosis present

## 2015-09-05 DIAGNOSIS — R938 Abnormal findings on diagnostic imaging of other specified body structures: Secondary | ICD-10-CM | POA: Insufficient documentation

## 2015-09-05 DIAGNOSIS — R932 Abnormal findings on diagnostic imaging of liver and biliary tract: Secondary | ICD-10-CM | POA: Insufficient documentation

## 2016-03-02 ENCOUNTER — Other Ambulatory Visit: Payer: Self-pay | Admitting: Family Medicine

## 2016-03-02 DIAGNOSIS — B182 Chronic viral hepatitis C: Secondary | ICD-10-CM

## 2016-04-05 ENCOUNTER — Ambulatory Visit
Admission: RE | Admit: 2016-04-05 | Discharge: 2016-04-05 | Disposition: A | Discharge status: Home / Self Care | Payer: Medicaid Other | Source: Ambulatory Visit | Attending: Family Medicine | Admitting: Family Medicine

## 2016-04-05 DIAGNOSIS — K824 Cholesterolosis of gallbladder: Secondary | ICD-10-CM | POA: Diagnosis not present

## 2016-04-05 DIAGNOSIS — B182 Chronic viral hepatitis C: Secondary | ICD-10-CM | POA: Diagnosis present

## 2017-03-14 ENCOUNTER — Other Ambulatory Visit: Payer: Self-pay | Admitting: Family Medicine

## 2017-03-14 DIAGNOSIS — Z8619 Personal history of other infectious and parasitic diseases: Secondary | ICD-10-CM

## 2017-08-23 ENCOUNTER — Other Ambulatory Visit: Payer: Self-pay | Admitting: Family Medicine

## 2017-08-23 DIAGNOSIS — Z8619 Personal history of other infectious and parasitic diseases: Secondary | ICD-10-CM

## 2017-09-07 ENCOUNTER — Ambulatory Visit
Admission: RE | Admit: 2017-09-07 | Discharge: 2017-09-07 | Disposition: A | Discharge status: Home / Self Care | Payer: Medicaid Other | Source: Ambulatory Visit | Attending: Family Medicine | Admitting: Family Medicine

## 2017-09-07 DIAGNOSIS — Z8619 Personal history of other infectious and parasitic diseases: Secondary | ICD-10-CM | POA: Diagnosis present

## 2018-03-03 ENCOUNTER — Other Ambulatory Visit: Payer: Self-pay | Admitting: Family Medicine

## 2018-03-03 DIAGNOSIS — Z8619 Personal history of other infectious and parasitic diseases: Secondary | ICD-10-CM

## 2018-03-24 ENCOUNTER — Ambulatory Visit
Admission: RE | Admit: 2018-03-24 | Discharge: 2018-03-24 | Disposition: A | Discharge status: Home / Self Care | Payer: Medicaid Other | Source: Ambulatory Visit | Attending: Family Medicine | Admitting: Family Medicine

## 2018-03-24 DIAGNOSIS — Z8619 Personal history of other infectious and parasitic diseases: Secondary | ICD-10-CM | POA: Insufficient documentation

## 2018-07-24 ENCOUNTER — Ambulatory Visit: Admit: 2018-07-24 | Payer: Medicaid Other | Admitting: Unknown Physician Specialty

## 2018-07-24 SURGERY — COLONOSCOPY WITH PROPOFOL
Anesthesia: General

## 2019-04-25 ENCOUNTER — Other Ambulatory Visit: Payer: Self-pay | Admitting: Family Medicine

## 2019-04-25 DIAGNOSIS — Z8619 Personal history of other infectious and parasitic diseases: Secondary | ICD-10-CM

## 2019-04-30 ENCOUNTER — Encounter (INDEPENDENT_AMBULATORY_CARE_PROVIDER_SITE_OTHER): Payer: Self-pay

## 2019-04-30 ENCOUNTER — Ambulatory Visit
Admission: RE | Admit: 2019-04-30 | Discharge: 2019-04-30 | Disposition: A | Discharge status: Home / Self Care | Payer: Medicaid Other | Source: Ambulatory Visit | Attending: Family Medicine | Admitting: Family Medicine

## 2019-04-30 ENCOUNTER — Other Ambulatory Visit: Payer: Self-pay

## 2019-04-30 DIAGNOSIS — Z8619 Personal history of other infectious and parasitic diseases: Secondary | ICD-10-CM | POA: Diagnosis present

## 2019-08-09 ENCOUNTER — Other Ambulatory Visit
Admission: RE | Admit: 2019-08-09 | Discharge: 2019-08-09 | Disposition: A | Discharge status: Home / Self Care | Payer: Medicaid Other | Source: Ambulatory Visit | Attending: Internal Medicine | Admitting: Internal Medicine

## 2019-08-09 DIAGNOSIS — Z01812 Encounter for preprocedural laboratory examination: Secondary | ICD-10-CM | POA: Insufficient documentation

## 2019-08-09 DIAGNOSIS — Z20822 Contact with and (suspected) exposure to covid-19: Secondary | ICD-10-CM | POA: Diagnosis not present

## 2019-08-10 ENCOUNTER — Encounter: Payer: Self-pay | Admitting: Internal Medicine

## 2019-08-10 LAB — SARS CORONAVIRUS 2 (TAT 6-24 HRS): SARS Coronavirus 2: NEGATIVE

## 2019-08-13 ENCOUNTER — Encounter
Admission: RE | Disposition: A | Discharge status: Home / Self Care | Payer: Self-pay | Source: Home / Self Care | Attending: Internal Medicine

## 2019-08-13 ENCOUNTER — Ambulatory Visit
Admission: RE | Admit: 2019-08-13 | Discharge: 2019-08-13 | Disposition: A | Discharge status: Home / Self Care | Payer: Medicaid Other | Attending: Internal Medicine | Admitting: Internal Medicine

## 2019-08-13 ENCOUNTER — Encounter: Payer: Self-pay | Admitting: Internal Medicine

## 2019-08-13 ENCOUNTER — Ambulatory Visit: Payer: Medicaid Other | Admitting: Registered Nurse

## 2019-08-13 ENCOUNTER — Other Ambulatory Visit: Payer: Self-pay

## 2019-08-13 DIAGNOSIS — Z79899 Other long term (current) drug therapy: Secondary | ICD-10-CM | POA: Insufficient documentation

## 2019-08-13 DIAGNOSIS — Z8673 Personal history of transient ischemic attack (TIA), and cerebral infarction without residual deficits: Secondary | ICD-10-CM | POA: Diagnosis not present

## 2019-08-13 DIAGNOSIS — Z7982 Long term (current) use of aspirin: Secondary | ICD-10-CM | POA: Insufficient documentation

## 2019-08-13 DIAGNOSIS — Z1211 Encounter for screening for malignant neoplasm of colon: Secondary | ICD-10-CM | POA: Diagnosis not present

## 2019-08-13 DIAGNOSIS — I10 Essential (primary) hypertension: Secondary | ICD-10-CM | POA: Insufficient documentation

## 2019-08-13 DIAGNOSIS — Z8719 Personal history of other diseases of the digestive system: Secondary | ICD-10-CM | POA: Diagnosis not present

## 2019-08-13 DIAGNOSIS — E7800 Pure hypercholesterolemia, unspecified: Secondary | ICD-10-CM | POA: Insufficient documentation

## 2019-08-13 HISTORY — PX: COLONOSCOPY WITH PROPOFOL: SHX5780

## 2019-08-13 SURGERY — COLONOSCOPY WITH PROPOFOL
Anesthesia: General

## 2019-08-13 MED ORDER — PROPOFOL 500 MG/50ML IV EMUL
INTRAVENOUS | Status: AC
  Filled 2019-08-13: qty 50

## 2019-08-13 MED ORDER — ONDANSETRON HCL 4 MG/2ML IJ SOLN
INTRAMUSCULAR | Status: DC | PRN
  Administered 2019-08-13: 4 mg via INTRAVENOUS

## 2019-08-13 MED ORDER — LIDOCAINE HCL (PF) 2 % IJ SOLN
INTRAMUSCULAR | Status: AC
  Filled 2019-08-13: qty 10

## 2019-08-13 MED ORDER — DEXMEDETOMIDINE HCL IN NACL 80 MCG/20ML IV SOLN
INTRAVENOUS | Status: AC
Start: 2019-08-13 — End: ?
  Filled 2019-08-13: qty 20

## 2019-08-13 MED ORDER — SODIUM CHLORIDE 0.9 % IV SOLN: INTRAVENOUS | Status: DC

## 2019-08-13 MED ORDER — LIDOCAINE HCL (PF) 2 % IJ SOLN
INTRAMUSCULAR | Status: DC | PRN
Start: 2019-08-13 — End: 2019-08-13
  Administered 2019-08-13: 40 mg via INTRADERMAL

## 2019-08-13 MED ORDER — PROPOFOL 10 MG/ML IV BOLUS
INTRAVENOUS | Status: DC | PRN
  Administered 2019-08-13: 30 ug via INTRAVENOUS
  Administered 2019-08-13: 125 ug/kg/min via INTRAVENOUS

## 2019-08-13 NOTE — Interval H&P Note (Signed)
History and Physical Interval Note:  08/13/2019 8:20 AM  Melvin Miles  has presented today for surgery, with the diagnosis of PH POLYPS.  The various methods of treatment have been discussed with the patient and family. After consideration of risks, benefits and other options for treatment, the patient has consented to  Procedure(s): COLONOSCOPY WITH PROPOFOL (N/A) as a surgical intervention.  The patient's history has been reviewed, patient examined, no change in status, stable for surgery.  I have reviewed the patient's chart and labs.  Questions were answered to the patient's satisfaction.     Devol, [place]

## 2019-08-13 NOTE — Anesthesia Preprocedure Evaluation (Signed)
Anesthesia Evaluation  Patient identified by MRN, date of birth, ID band Patient awake    Reviewed: Allergy & Precautions, NPO status , Patient's Chart, lab work & pertinent test results  History of Anesthesia Complications Negative for: history of anesthetic complications  Airway Mallampati: II  TM Distance: >3 FB Neck ROM: Full    Dental  (+) Upper Dentures, Lower Dentures   Pulmonary neg sleep apnea, neg COPD, Current SmokerPatient did not abstain from smoking.,    breath sounds clear to auscultation- rhonchi (-) wheezing      Cardiovascular hypertension, Pt. on medications (-) CAD, (-) Past MI, (-) Cardiac Stents and (-) CABG  Rhythm:Regular Rate:Normal - Systolic murmurs and - Diastolic murmurs    Neuro/Psych PSYCHIATRIC DISORDERS (EtOH abuse) CVA, No Residual Symptoms    GI/Hepatic negative GI ROS, Neg liver ROS,   Endo/Other  negative endocrine ROSneg diabetes  Renal/GU negative Renal ROS     Musculoskeletal negative musculoskeletal ROS (+)   Abdominal (+) - obese,   Peds  Hematology negative hematology ROS (+)   Anesthesia Other Findings Past Medical History: No date: Alcohol abuse No date: Drug use No date: Hypercholesterolemia No date: Hypertension No date: Stroke [facility])   Reproductive/Obstetrics                             Anesthesia Physical Anesthesia Plan  ASA: III  Anesthesia Plan: General   Post-op Pain Management:    Induction: Intravenous  PONV Risk Score and Plan: 0 and Propofol infusion  Airway Management Planned: Natural Airway  Additional Equipment:   Intra-op Plan:   Post-operative Plan:   Informed Consent: I have reviewed the patients History and Physical, chart, labs and discussed the procedure including the risks, benefits and alternatives for the proposed anesthesia with the patient or authorized representative who has indicated his/her  understanding and acceptance.     Dental advisory given  Plan Discussed with: CRNA and Anesthesiologist  Anesthesia Plan Comments:         Anesthesia Quick Evaluation

## 2019-08-13 NOTE — Anesthesia Postprocedure Evaluation (Signed)
Anesthesia Post Note  Patient: Melvin Miles  Procedure(s) Performed: COLONOSCOPY WITH PROPOFOL (N/A )  Patient location during evaluation: Endoscopy Anesthesia Type: General Level of consciousness: awake and alert and oriented Pain management: pain level controlled Vital Signs Assessment: post-procedure vital signs reviewed and stable Respiratory status: spontaneous breathing, nonlabored ventilation and respiratory function stable Cardiovascular status: blood pressure returned to baseline and stable Postop Assessment: no signs of nausea or vomiting Anesthetic complications: no     Last Vitals:  Vitals:   08/13/19 0836 08/13/19 0837  BP: 124/86   Pulse: 76 77  Resp: 12 (!) 7  Temp: 36.7 C   SpO2: 98% 99%    Last Pain:  Vitals:   08/13/19 0856  TempSrc:   PainSc: 0-No pain                 [name]

## 2019-08-13 NOTE — Transfer of Care (Signed)
Immediate Anesthesia Transfer of Care Note  Patient: Melvin Miles  Procedure(s) Performed: COLONOSCOPY WITH PROPOFOL (N/A )  Patient Location: PACU  Anesthesia Type:General  Level of Consciousness: sedated  Airway & Oxygen Therapy: Patient Spontanous Breathing and Patient connected to nasal cannula oxygen  Post-op Assessment: Report given to RN and Post -op Vital signs reviewed and stable  Post vital signs: Reviewed and stable  Last Vitals:  Vitals Value Taken Time  BP    Temp    Pulse 77 08/13/19 0837  Resp 7 08/13/19 0837  SpO2 99 % 08/13/19 0837    Last Pain:  Vitals:   08/13/19 0747  TempSrc: Temporal  PainSc: 0-No pain         Complications: No apparent anesthesia complications

## 2019-08-13 NOTE — H&P (Signed)
Outpatient short stay form Pre-procedure 08/13/2019 8:18 AM Melvin Miles K. Alice Reichert, M.D.  Primary Physician: Steele Sizer, M.D.  Reason for visit:  Personal hx of colon polyps (Adenomatous polyps x 4 - 2017)  History of present illness:                           Patient presents for colonoscopy for a personal hx of colon polyps. The patient denies abdominal pain, abnormal weight loss or rectal bleeding.      Current Facility-Administered Medications:  .  0.9 %  sodium chloride infusion, , Intravenous, Continuous, [place], Benay Pike, MD, Last Rate: 20 mL/hr at 08/13/19 0803, New Bag at 08/13/19 0803  Medications Prior to Admission  Medication Sig Dispense Refill Last Dose  . amLODipine (NORVASC) 10 MG tablet Take 10 mg by mouth daily.   08/13/2019 at 0530  . lisinopril (PRINIVIL,ZESTRIL) 20 MG tablet Take 20 mg by mouth daily.   08/13/2019 at 0530  . aspirin 81 MG tablet Take 81 mg by mouth daily.     Marland Kitchen atorvastatin (LIPITOR) 80 MG tablet Take 80 mg by mouth daily.     . folic acid (FOLVITE) 1 MG tablet Take 1 mg by mouth daily.     Marland Kitchen oxyCODONE (ROXICODONE) 15 MG immediate release tablet Take 15 mg by mouth every 4 (four) hours as needed for pain.        No Known Allergies   Past Medical History:  Diagnosis Date  . Alcohol abuse   . Drug use   . Hypercholesterolemia   . Hypertension   . Stroke [facility])     Review of systems:  Otherwise negative.    Physical Exam  Gen: Alert, oriented. Appears stated age.  HEENT: [place]/AT. PERRLA. Lungs: CTA, no wheezes. CV: RR nl S1, S2. Abd: soft, benign, no masses. BS+ Ext: No edema. Pulses 2+    Planned procedures: Proceed with colonoscopy. The patient understands the nature of the planned procedure, indications, risks, alternatives and potential complications including but not limited to bleeding, infection, perforation, damage to internal organs and possible oversedation/side effects from anesthesia. The patient agrees and gives consent to  proceed.  Please refer to procedure notes for findings, recommendations and patient disposition/instructions.     Zuley Lutter K. Alice Reichert, M.D. Gastroenterology 08/13/2019  8:18 AM

## 2019-08-13 NOTE — Op Note (Signed)
[facility] Gastroenterology Patient Name: Melvin Miles Procedure Date: 08/13/2019 7:13 AM MRN: [id number] Account #: [id number] Date of Birth: 1956-02-06 Admit Type: Outpatient Age: 64 Room: [facility] ENDO ROOM 3 Gender: Male Note Status: Finalized Procedure:             Colonoscopy Indications:           High risk colon cancer surveillance: Personal history                         of multiple (3 or more) adenomas Providers:             Benay Pike. Alice Reichert MD, MD Referring MD:          Bethena Roys. Sowles, MD (Referring MD) Medicines:             Propofol per Anesthesia Complications:         No immediate complications. Procedure:             Pre-Anesthesia Assessment:                        - The risks and benefits of the procedure and the                         sedation options and risks were discussed with the                         patient. All questions were answered and informed                         consent was obtained.                        - Patient identification and proposed procedure were                         verified prior to the procedure by the nurse. The                         procedure was verified in the procedure room.                        - ASA Grade Assessment: III - A patient with severe                         systemic disease.                        - After reviewing the risks and benefits, the patient                         was deemed in satisfactory condition to undergo the                         procedure.                        After obtaining informed consent, the colonoscope was                         passed under direct  vision. Throughout the procedure,                         the patient's blood pressure, pulse, and oxygen                         saturations were monitored continuously. The                         Colonoscope was introduced through the anus with the                         intention of advancing to the  splenic flexure. The                         scope was advanced to the sigmoid colon before the                         procedure was aborted. Medications were given. The                         colonoscopy was performed with difficulty due to                         inadequate bowel prep. The patient tolerated the                         procedure well. The quality of the bowel preparation                         was poor and not adequate to identify polyps 6 mm and                         larger in size. The colonoscopy was aborted. Findings:      The perianal and digital rectal examinations were normal. Pertinent       negatives include normal sphincter tone and no palpable rectal lesions.      Normal mucosa was found in the sigmoid colon.      The retroflexed view of the distal rectum and anal verge was normal and       showed no anal or rectal abnormalities.      Copious quantities of semi-solid stool was found in the proximal sigmoid       colon, precluding visualization. Lavage of the area was performed using       a moderate amount of sterile water, resulting in incomplete clearance       with continued poor visualization. Impression:            - Preparation of the colon was poor.                        - Preparation of the colon was inadequate.                        - The procedure was aborted.                        - Normal mucosa in the sigmoid colon.                        -  The distal rectum and anal verge are normal on                         retroflexion view.                        - Stool in the proximal sigmoid colon.                        - No specimens collected. Recommendation:        - Patient has a contact number available for                         emergencies. The signs and symptoms of potential                         delayed complications were discussed with the patient.                         Return to normal activities tomorrow. Written                          discharge instructions were provided to the patient.                        - Resume previous diet.                        - Continue present medications.                        - For future colonoscopy the patient will require an                         extended preparation. If there are any questions,                         please contact the gastroenterologist.                        - The findings and recommendations were discussed with                         the patient. Procedure Code(s):     --- Professional ---                        [id number], 53, Colorectal cancer screening; colonoscopy on                         individual at high risk Diagnosis Code(s):     --- Professional ---                        Z86.010, Personal history of colonic polyps CPT copyright 2019 American Medical Association. All rights reserved. The codes documented in this report are preliminary and upon coder review may  be revised to meet current compliance requirements. Efrain Sella MD, MD 08/13/2019 8:37:31 AM This report has been signed electronically. Number of Addenda: 0 Note Initiated On: 08/13/2019 7:13 AM Total Procedure Duration: 0 hours 4 minutes  24 seconds  Estimated Blood Loss:  Estimated blood loss: none.      [facility]

## 2019-08-14 ENCOUNTER — Encounter: Payer: Self-pay | Admitting: *Deleted

## 2019-09-07 ENCOUNTER — Telehealth: Payer: Self-pay | Admitting: *Deleted

## 2019-09-07 DIAGNOSIS — Z87891 Personal history of nicotine dependence: Secondary | ICD-10-CM

## 2019-09-07 DIAGNOSIS — Z122 Encounter for screening for malignant neoplasm of respiratory organs: Secondary | ICD-10-CM

## 2019-09-07 NOTE — Telephone Encounter (Signed)
Received referral for initial lung cancer screening scan. Contacted patient and obtained smoking history,(current, 50 pack year) as well as answering questions related to screening process. Patient denies signs of lung cancer such as weight loss or hemoptysis. Patient denies comorbidity that would prevent curative treatment if lung cancer were found. Patient is scheduled for shared decision making visit and CT scan on 09/25/19 at 2pm.

## 2019-09-25 ENCOUNTER — Other Ambulatory Visit: Payer: Self-pay

## 2019-09-25 ENCOUNTER — Inpatient Hospital Stay: Payer: Medicaid Other | Attending: Oncology | Admitting: Hospice and Palliative Medicine

## 2019-09-25 ENCOUNTER — Encounter: Payer: Self-pay | Admitting: Oncology

## 2019-09-25 ENCOUNTER — Ambulatory Visit
Admission: RE | Admit: 2019-09-25 | Discharge: 2019-09-25 | Disposition: A | Discharge status: Home / Self Care | Payer: Medicaid Other | Source: Ambulatory Visit | Attending: Oncology | Admitting: Oncology

## 2019-09-25 DIAGNOSIS — Z87891 Personal history of nicotine dependence: Secondary | ICD-10-CM

## 2019-09-25 DIAGNOSIS — Z122 Encounter for screening for malignant neoplasm of respiratory organs: Secondary | ICD-10-CM | POA: Insufficient documentation

## 2019-09-25 NOTE — Progress Notes (Signed)
Virtual Visit via Video Note  I connected with@ on 09/25/19 at@ by a video enabled telemedicine application and verified that I am speaking with the correct person using two identifiers.   I discussed the limitations of evaluation and management by telemedicine and the availability of in person appointments. The patient expressed understanding and agreed to proceed.  In accordance with CMS guidelines, patient has met eligibility criteria including age, absence of signs or symptoms of lung cancer.  Social History   Tobacco Use  . Smoking status: Current Every Day Smoker    Packs/day: 1.00    Years: 50.00    Pack years: 50.00  . Smokeless tobacco: Never Used  Substance Use Topics  . Alcohol use: Yes    Alcohol/week: 2.0 standard drinks    Types: 2 Cans of beer per week    Comment: history of alcohol abuse  . Drug use: Not Currently    Comment: history of drug use      A shared decision-making session was conducted prior to the performance of CT scan. This includes one or more decision aids, includes benefits and harms of screening, follow-up diagnostic testing, over-diagnosis, false positive rate, and total radiation exposure.   Counseling on the importance of adherence to annual lung cancer LDCT screening, impact of co-morbidities, and ability or willingness to undergo diagnosis and treatment is imperative for compliance of the program.   Counseling on the importance of continued smoking cessation for former smokers; the importance of smoking cessation for current smokers, and information about tobacco cessation interventions have been given to patient including [place] and [facility] quit [place] programs.   Written order for lung cancer screening with LDCT has been given to the patient and any and all questions have been answered to the best of my abilities.    Yearly follow up will be coordinated by Burgess Estelle, Thoracic Navigator.  Time Total: 15 minutes  Visit consisted  of counseling and education dealing with complex health screening. Greater than 50%  of this time was spent counseling and coordinating care related to the above assessment and plan.  Signed by: Altha Harm, PhD, NP-C [phone] (Work Cell)

## 2019-09-27 ENCOUNTER — Encounter: Payer: Self-pay | Admitting: *Deleted

## 2019-10-11 ENCOUNTER — Other Ambulatory Visit: Payer: Self-pay

## 2019-11-28 ENCOUNTER — Other Ambulatory Visit: Payer: Medicaid Other

## 2019-12-12 ENCOUNTER — Other Ambulatory Visit: Payer: Self-pay

## 2019-12-12 ENCOUNTER — Other Ambulatory Visit
Admission: RE | Admit: 2019-12-12 | Discharge: 2019-12-12 | Disposition: A | Discharge status: Home / Self Care | Payer: Medicaid Other | Source: Ambulatory Visit | Attending: Gastroenterology | Admitting: Gastroenterology

## 2019-12-12 DIAGNOSIS — Z01812 Encounter for preprocedural laboratory examination: Secondary | ICD-10-CM | POA: Insufficient documentation

## 2019-12-12 DIAGNOSIS — Z20822 Contact with and (suspected) exposure to covid-19: Secondary | ICD-10-CM | POA: Diagnosis not present

## 2019-12-13 ENCOUNTER — Encounter: Payer: Self-pay | Admitting: *Deleted

## 2019-12-13 LAB — SARS CORONAVIRUS 2 (TAT 6-24 HRS): SARS Coronavirus 2: NEGATIVE

## 2019-12-14 ENCOUNTER — Ambulatory Visit: Payer: Medicaid Other | Admitting: Anesthesiology

## 2019-12-14 ENCOUNTER — Encounter
Admission: RE | Disposition: A | Discharge status: Home / Self Care | Payer: Self-pay | Source: Home / Self Care | Attending: Gastroenterology

## 2019-12-14 ENCOUNTER — Other Ambulatory Visit: Payer: Self-pay

## 2019-12-14 ENCOUNTER — Encounter: Payer: Self-pay | Admitting: *Deleted

## 2019-12-14 ENCOUNTER — Ambulatory Visit
Admission: RE | Admit: 2019-12-14 | Discharge: 2019-12-14 | Disposition: A | Discharge status: Home / Self Care | Payer: Medicaid Other | Attending: Gastroenterology | Admitting: Gastroenterology

## 2019-12-14 DIAGNOSIS — Z1211 Encounter for screening for malignant neoplasm of colon: Secondary | ICD-10-CM | POA: Insufficient documentation

## 2019-12-14 DIAGNOSIS — Z5309 Procedure and treatment not carried out because of other contraindication: Secondary | ICD-10-CM | POA: Insufficient documentation

## 2019-12-14 DIAGNOSIS — Z8371 Family history of colonic polyps: Secondary | ICD-10-CM | POA: Diagnosis present

## 2019-12-14 SURGERY — COLONOSCOPY WITH PROPOFOL
Anesthesia: General

## 2019-12-14 MED ORDER — SODIUM CHLORIDE 0.9 % IV SOLN: INTRAVENOUS | Status: DC

## 2019-12-14 MED ORDER — FENTANYL CITRATE (PF) 100 MCG/2ML IJ SOLN
INTRAMUSCULAR | Status: AC
  Filled 2019-12-14: qty 2

## 2019-12-14 MED ORDER — PROPOFOL 10 MG/ML IV BOLUS
INTRAVENOUS | Status: AC
  Filled 2019-12-14: qty 20

## 2019-12-14 MED ORDER — MIDAZOLAM HCL 2 MG/2ML IJ SOLN
INTRAMUSCULAR | Status: AC
  Filled 2019-12-14: qty 2

## 2019-12-14 NOTE — OR Nursing (Signed)
Patient ate pintos and corn yesterday, did not drink all the prep and drank coffee with cream this morning at 0530am.  Stated he was still having brown liquid stool.  Dr Haig Prophet decided to reschedule.  I gave Mr Melvin Miles. Simple instructions for next time

## 2020-01-10 ENCOUNTER — Other Ambulatory Visit: Admission: RE | Admit: 2020-01-10 | Payer: Medicaid Other | Source: Ambulatory Visit

## 2020-01-14 ENCOUNTER — Ambulatory Visit: Admission: RE | Admit: 2020-01-14 | Payer: Medicaid Other | Source: Home / Self Care

## 2020-01-14 ENCOUNTER — Encounter: Admission: RE | Payer: Self-pay | Source: Home / Self Care

## 2020-01-14 SURGERY — COLONOSCOPY WITH PROPOFOL
Anesthesia: General

## 2020-02-21 ENCOUNTER — Other Ambulatory Visit: Payer: Self-pay

## 2020-02-21 ENCOUNTER — Other Ambulatory Visit
Admission: RE | Admit: 2020-02-21 | Discharge: 2020-02-21 | Disposition: A | Discharge status: Home / Self Care | Payer: Medicaid Other | Source: Ambulatory Visit | Attending: Gastroenterology | Admitting: Gastroenterology

## 2020-02-21 DIAGNOSIS — Z20822 Contact with and (suspected) exposure to covid-19: Secondary | ICD-10-CM | POA: Insufficient documentation

## 2020-02-21 DIAGNOSIS — Z01812 Encounter for preprocedural laboratory examination: Secondary | ICD-10-CM | POA: Diagnosis not present

## 2020-02-21 LAB — SARS CORONAVIRUS 2 (TAT 6-24 HRS): SARS Coronavirus 2: NEGATIVE

## 2020-02-25 ENCOUNTER — Ambulatory Visit
Admission: RE | Admit: 2020-02-25 | Discharge: 2020-02-25 | Disposition: A | Discharge status: Home / Self Care | Payer: Medicaid Other | Attending: Gastroenterology | Admitting: Gastroenterology

## 2020-02-25 ENCOUNTER — Other Ambulatory Visit: Payer: Self-pay

## 2020-02-25 ENCOUNTER — Encounter: Payer: Self-pay | Admitting: *Deleted

## 2020-02-25 ENCOUNTER — Encounter
Admission: RE | Disposition: A | Discharge status: Home / Self Care | Payer: Self-pay | Source: Home / Self Care | Attending: Gastroenterology

## 2020-02-25 ENCOUNTER — Ambulatory Visit: Payer: Medicaid Other | Admitting: Certified Registered"

## 2020-02-25 DIAGNOSIS — Z79899 Other long term (current) drug therapy: Secondary | ICD-10-CM | POA: Diagnosis not present

## 2020-02-25 DIAGNOSIS — I10 Essential (primary) hypertension: Secondary | ICD-10-CM | POA: Insufficient documentation

## 2020-02-25 DIAGNOSIS — Z86010 Personal history of colonic polyps: Secondary | ICD-10-CM | POA: Diagnosis not present

## 2020-02-25 DIAGNOSIS — Z7982 Long term (current) use of aspirin: Secondary | ICD-10-CM | POA: Insufficient documentation

## 2020-02-25 DIAGNOSIS — Z8673 Personal history of transient ischemic attack (TIA), and cerebral infarction without residual deficits: Secondary | ICD-10-CM | POA: Insufficient documentation

## 2020-02-25 DIAGNOSIS — Z5309 Procedure and treatment not carried out because of other contraindication: Secondary | ICD-10-CM | POA: Diagnosis not present

## 2020-02-25 DIAGNOSIS — Z1211 Encounter for screening for malignant neoplasm of colon: Secondary | ICD-10-CM | POA: Insufficient documentation

## 2020-02-25 HISTORY — PX: COLONOSCOPY WITH PROPOFOL: SHX5780

## 2020-02-25 SURGERY — COLONOSCOPY WITH PROPOFOL
Anesthesia: General

## 2020-02-25 MED ORDER — LIDOCAINE HCL (CARDIAC) PF 100 MG/5ML IV SOSY
PREFILLED_SYRINGE | INTRAVENOUS | Status: DC | PRN
  Administered 2020-02-25: 20 mg via INTRAVENOUS

## 2020-02-25 MED ORDER — PROPOFOL 10 MG/ML IV BOLUS
INTRAVENOUS | Status: DC | PRN
  Administered 2020-02-25: 50 mg via INTRAVENOUS

## 2020-02-25 MED ORDER — GLYCOPYRROLATE 0.2 MG/ML IJ SOLN
INTRAMUSCULAR | Status: DC | PRN
  Administered 2020-02-25: .2 mg via INTRAVENOUS

## 2020-02-25 MED ORDER — EPHEDRINE SULFATE 50 MG/ML IJ SOLN: INTRAMUSCULAR | Status: DC | PRN

## 2020-02-25 MED ORDER — MIDAZOLAM HCL 2 MG/2ML IJ SOLN
INTRAMUSCULAR | Status: AC
  Filled 2020-02-25: qty 2

## 2020-02-25 MED ORDER — SODIUM CHLORIDE 0.9 % IV SOLN: INTRAVENOUS | Status: DC

## 2020-02-25 MED ORDER — PROPOFOL 500 MG/50ML IV EMUL
INTRAVENOUS | Status: DC | PRN
  Administered 2020-02-25: 155 ug/kg/min via INTRAVENOUS

## 2020-02-25 NOTE — Anesthesia Procedure Notes (Signed)
Procedure Name: General with mask airway Performed by: Fletcher-Harrison, Guadalupe Kerekes, CRNA Pre-anesthesia Checklist: Patient identified, Emergency Drugs available, Suction available and Patient being monitored Patient Re-evaluated:Patient Re-evaluated prior to induction Oxygen Delivery Method: Simple face mask Induction Type: IV induction Placement Confirmation: positive ETCO2 and CO2 detector Dental Injury: Teeth and Oropharynx as per pre-operative assessment        

## 2020-02-25 NOTE — H&P (Signed)
Outpatient short stay form Pre-procedure 02/25/2020 11:08 AM Raylene Miyamoto MD, MPH  Primary Physician: Dr. Ancil Boozer  Reason for visit:  Surveillance Colonoscopy  History of present illness:   64 y/o gentleman with history of polyps here for surveillance colonoscopy. No family history of GI malignancies. No blood thinners. No abdominal surgeries.    Current Facility-Administered Medications:  .  0.9 %  sodium chloride infusion, , Intravenous, Continuous, Ronnette Rump, Hilton Cork, MD, Last Rate: 20 mL/hr at 02/25/20 1056, New Bag at 02/25/20 1056  Medications Prior to Admission  Medication Sig Dispense Refill Last Dose  . amLODipine (NORVASC) 10 MG tablet Take 10 mg by mouth daily.   02/25/2020 at Unknown time  . aspirin 81 MG tablet Take 81 mg by mouth daily.   02/24/2020 at Unknown time  . atorvastatin (LIPITOR) 80 MG tablet Take 80 mg by mouth daily.   02/25/2020 at Unknown time  . folic acid (FOLVITE) 1 MG tablet Take 1 mg by mouth daily.   02/25/2020 at Unknown time  . lisinopril (PRINIVIL,ZESTRIL) 20 MG tablet Take 20 mg by mouth daily.   02/25/2020 at Unknown time  . oxyCODONE (ROXICODONE) 15 MG immediate release tablet Take 15 mg by mouth every 4 (four) hours as needed for pain.        No Known Allergies   Past Medical History:  Diagnosis Date  . Alcohol abuse   . Drug use   . Hypercholesterolemia   . Hypertension   . Stroke [facility])     Review of systems:  Otherwise negative.    Physical Exam  Gen: Alert, oriented. Appears stated age.  HEENT: PERRLA. Lungs: No respiratory distress CV: RRR Abd: soft, benign, no masses Ext: No edema    Planned procedures: Proceed with colonoscopy. The patient understands the nature of the planned procedure, indications, risks, alternatives and potential complications including but not limited to bleeding, infection, perforation, damage to internal organs and possible oversedation/side effects from anesthesia. The patient agrees and gives  consent to proceed.  Please refer to procedure notes for findings, recommendations and patient disposition/instructions.     Raylene Miyamoto MD, MPH Gastroenterology 02/25/2020  11:08 AM

## 2020-02-25 NOTE — Transfer of Care (Signed)
Immediate Anesthesia Transfer of Care Note  Patient: Melvin Miles  Procedure(s) Performed: COLONOSCOPY WITH PROPOFOL (N/A )  Patient Location: Endoscopy Unit  Anesthesia Type:General  Level of Consciousness: awake, drowsy and patient cooperative  Airway & Oxygen Therapy: Patient Spontanous Breathing and Patient connected to face mask oxygen  Post-op Assessment: Report given to RN and Post -op Vital signs reviewed and stable  Post vital signs: Reviewed and stable  Last Vitals:  Vitals Value Taken Time  BP 125/84 02/25/20 1125  Temp    Pulse 91 02/25/20 1125  Resp 20 02/25/20 1125  SpO2 100 % 02/25/20 1125  Vitals shown include unvalidated device data.  Last Pain:  Vitals:   02/25/20 1041  TempSrc: Temporal  PainSc: 0-No pain         Complications: No complications documented.

## 2020-02-25 NOTE — Brief Op Note (Signed)
Colonoscopy aborted due to solid stool present in rectum.

## 2020-02-25 NOTE — Interval H&P Note (Signed)
History and Physical Interval Note:  02/25/2020 11:11 AM  Melvin Miles  has presented today for surgery, with the diagnosis of personal history polyps.  The various methods of treatment have been discussed with the patient and family. After consideration of risks, benefits and other options for treatment, the patient has consented to  Procedure(s): COLONOSCOPY WITH PROPOFOL (N/A) as a surgical intervention.  The patient's history has been reviewed, patient examined, no change in status, stable for surgery.  I have reviewed the patient's chart and labs.  Questions were answered to the patient's satisfaction.     Lesly Rubenstein  Ok to proceed with colonoscopy

## 2020-02-25 NOTE — Op Note (Signed)
[facility] Gastroenterology Patient Name: Melvin Miles Procedure Date: 02/25/2020 11:11 AM MRN: [id number] Account #: [id number] Date of Birth: May 07, 1955 Admit Type: Outpatient Age: 64 Room: [facility] ENDO ROOM 3 Gender: Male Note Status: Finalized Procedure:             Colonoscopy Indications:           High risk colon cancer surveillance: Personal history                         of multiple (3 or more) adenomas Providers:             Andrey Farmer MD, MD Medicines:             Monitored Anesthesia Care Complications:         No immediate complications. Procedure:             Pre-Anesthesia Assessment:                        - Prior to the procedure, a History and Physical was                         performed, and patient medications and allergies were                         reviewed. The patient is competent. The risks and                         benefits of the procedure and the sedation options and                         risks were discussed with the patient. All questions                         were answered and informed consent was obtained.                         Patient identification and proposed procedure were                         verified by the physician, the nurse, the anesthetist                         and the technician in the endoscopy suite. Mental                         Status Examination: alert and oriented. Airway                         Examination: normal oropharyngeal airway and neck                         mobility. Respiratory Examination: clear to                         auscultation. CV Examination: normal. Prophylactic                         Antibiotics: The patient does not require prophylactic  antibiotics. Prior Anticoagulants: The patient has                         taken no previous anticoagulant or antiplatelet                         agents. ASA Grade Assessment: II - A patient with mild                          systemic disease. After reviewing the risks and                         benefits, the patient was deemed in satisfactory                         condition to undergo the procedure. The anesthesia                         plan was to use monitored anesthesia care (MAC).                         Immediately prior to administration of medications,                         the patient was re-assessed for adequacy to receive                         sedatives. The heart rate, respiratory rate, oxygen                         saturations, blood pressure, adequacy of pulmonary                         ventilation, and response to care were monitored                         throughout the procedure. The physical status of the                         patient was re-assessed after the procedure.                        After obtaining informed consent, the colonoscope was                         passed under direct vision. Throughout the procedure,                         the patient's blood pressure, pulse, and oxygen                         saturations were monitored continuously. The                         Colonoscope was introduced through the anus with the                         intention of advancing to the cecum. The scope was  advanced to the sigmoid colon before the procedure was                         aborted. Medications were given. The colonoscopy was                         aborted due to poor bowel prep with stool present. Findings:      The perianal and digital rectal examinations were normal.      A large amount of solid stool was found in the recto-sigmoid colon,       precluding visualization. Impression:            - The procedure was aborted due to poor bowel prep                         with stool present.                        - Stool in the recto-sigmoid colon.                        - No specimens collected. Recommendation:        -  Discharge patient to home.                        - Resume previous diet.                        - Continue present medications.                        - Repeat colonoscopy at the next available appointment                         because the bowel preparation was suboptimal.                        - Return to referring physician as previously                         scheduled. Procedure Code(s):     --- Professional ---                        F7510, 53, Colorectal cancer screening; colonoscopy on                         individual at high risk Diagnosis Code(s):     --- Professional ---                        Z86.010, Personal history of colonic polyps CPT copyright 2019 American Medical Association. All rights reserved. The codes documented in this report are preliminary and upon coder review may  be revised to meet current compliance requirements. Andrey Farmer, MD Andrey Farmer MD, MD 02/25/2020 11:24:19 AM Number of Addenda: 0 Note Initiated On: 02/25/2020 11:11 AM Total Procedure Duration: 0 hours 6 minutes 0 seconds  Estimated Blood Loss:  Estimated blood loss: none.      [facility]

## 2020-02-25 NOTE — Anesthesia Preprocedure Evaluation (Signed)
Anesthesia Evaluation  Patient identified by MRN, date of birth, ID band Patient awake    Reviewed: Allergy & Precautions, NPO status , Patient's Chart, lab work & pertinent test results  History of Anesthesia Complications Negative for: history of anesthetic complications  Airway Mallampati: II  TM Distance: >3 FB Neck ROM: Full    Dental  (+) Upper Dentures, Lower Dentures   Pulmonary neg sleep apnea, neg COPD, Current Smoker and Patient abstained from smoking.,    breath sounds clear to auscultation- rhonchi (-) wheezing      Cardiovascular hypertension, Pt. on medications (-) CAD, (-) Past MI, (-) Cardiac Stents and (-) CABG  Rhythm:Regular Rate:Normal - Systolic murmurs and - Diastolic murmurs    Neuro/Psych PSYCHIATRIC DISORDERS (EtOH abuse) CVA, No Residual Symptoms    GI/Hepatic negative GI ROS, Neg liver ROS,   Endo/Other  negative endocrine ROSneg diabetes  Renal/GU negative Renal ROS     Musculoskeletal negative musculoskeletal ROS (+)   Abdominal (+) - obese,   Peds  Hematology negative hematology ROS (+)   Anesthesia Other Findings Past Medical History: No date: Alcohol abuse No date: Drug use No date: Hypercholesterolemia No date: Hypertension No date: Stroke [facility])   Reproductive/Obstetrics                             Anesthesia Physical  Anesthesia Plan  ASA: III  Anesthesia Plan: General   Post-op Pain Management:    Induction: Intravenous  PONV Risk Score and Plan: 0 and Propofol infusion  Airway Management Planned: Natural Airway and Nasal Cannula  Additional Equipment:   Intra-op Plan:   Post-operative Plan:   Informed Consent: I have reviewed the patients History and Physical, chart, labs and discussed the procedure including the risks, benefits and alternatives for the proposed anesthesia with the patient or authorized representative who has  indicated his/her understanding and acceptance.     Dental advisory given  Plan Discussed with: CRNA and Anesthesiologist  Anesthesia Plan Comments:         Anesthesia Quick Evaluation

## 2020-02-26 ENCOUNTER — Encounter: Payer: Self-pay | Admitting: Gastroenterology

## 2020-02-26 NOTE — Anesthesia Postprocedure Evaluation (Signed)
Anesthesia Post Note  Patient: Melvin Miles  Procedure(s) Performed: COLONOSCOPY WITH PROPOFOL (N/A )  Patient location during evaluation: Endoscopy Anesthesia Type: General Level of consciousness: awake and alert and oriented Pain management: pain level controlled Vital Signs Assessment: post-procedure vital signs reviewed and stable Respiratory status: spontaneous breathing Cardiovascular status: blood pressure returned to baseline Anesthetic complications: no   No complications documented.   Last Vitals:  Vitals:   02/25/20 1135 02/25/20 1145  BP: (!) 133/92 (!) 129/93  Pulse: 85 88  Resp: 14 16  Temp:    SpO2: 100% 100%    Last Pain:  Vitals:   02/26/20 0748  TempSrc:   PainSc: 0-No pain                 Icarus Partch

## 2020-04-04 ENCOUNTER — Other Ambulatory Visit: Admission: RE | Admit: 2020-04-04 | Payer: Medicaid Other | Source: Ambulatory Visit

## 2020-04-08 ENCOUNTER — Encounter: Admission: RE | Payer: Self-pay | Source: Home / Self Care

## 2020-04-08 ENCOUNTER — Ambulatory Visit: Admission: RE | Admit: 2020-04-08 | Payer: Medicaid Other | Source: Home / Self Care

## 2020-04-08 SURGERY — COLONOSCOPY WITH PROPOFOL
Anesthesia: General

## 2021-02-08 ENCOUNTER — Encounter: Payer: Self-pay | Admitting: Family Medicine

## 2021-04-10 ENCOUNTER — Telehealth: Payer: Self-pay | Admitting: Acute Care

## 2021-04-10 NOTE — Telephone Encounter (Signed)
Left voicemail for patient to call back for scheduling annual LDCT

## 2021-07-24 IMAGING — CT CT CHEST LUNG CANCER SCREENING LOW DOSE W/O CM
2 of 5 series · 15 of 40 positions shown, 18 images · non-contrast
Comparison: No priors.

CLINICAL DATA: 64-year-old male current smoker with 50 pack-year
history of smoking. Lung cancer screening examination.

EXAM:
CT CHEST WITHOUT CONTRAST LOW-DOSE FOR LUNG CANCER SCREENING
TECHNIQUE: Multidetector CT imaging of the chest was performed following the
standard protocol without IV contrast.

[Series 3: lung 1.00 · axial · 0.68mm/px · z∈[-1204,-886]mm · 12 of 350 slices shown, 15 images]
[im 16/350  mediastinal]
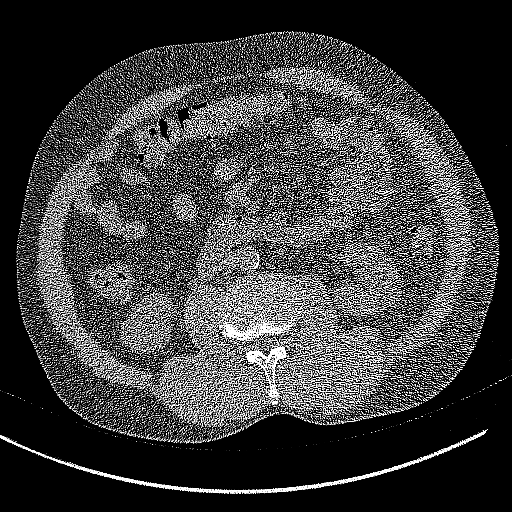
[im 16/350  lung]
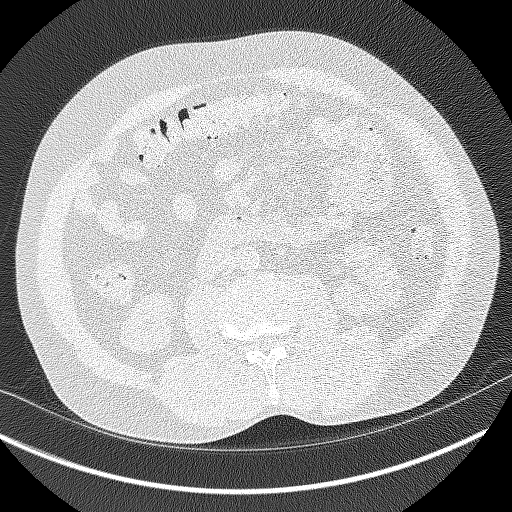
[im 48/350  lung]
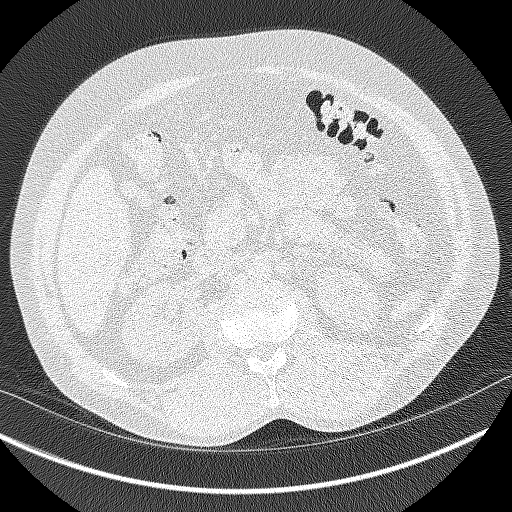
[im 80/350  lung]
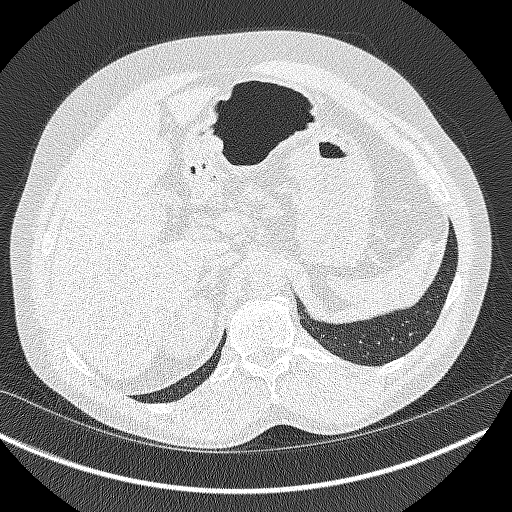
[im 112/350  lung]
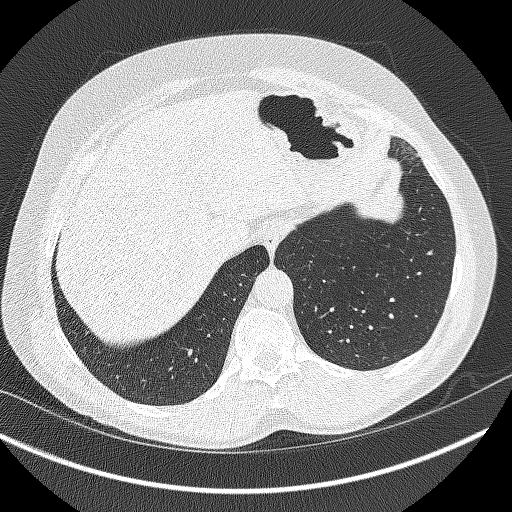
[im 127/350  mediastinal]
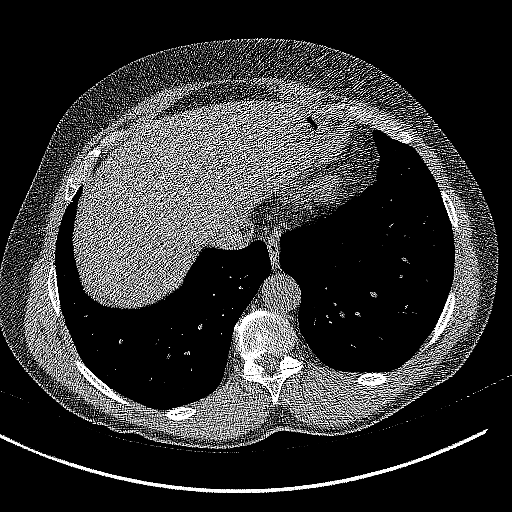
[im 127/350  lung]
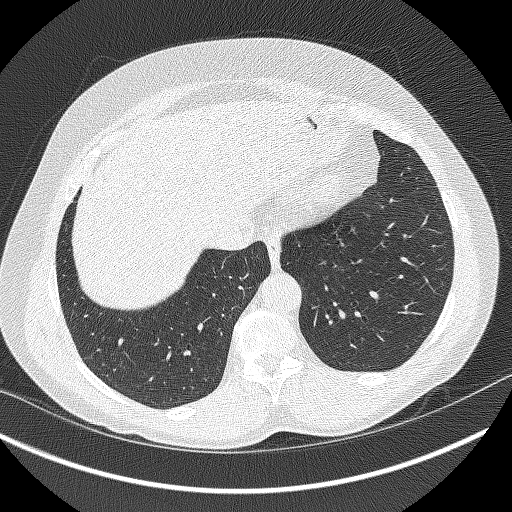
[im 159/350  lung]
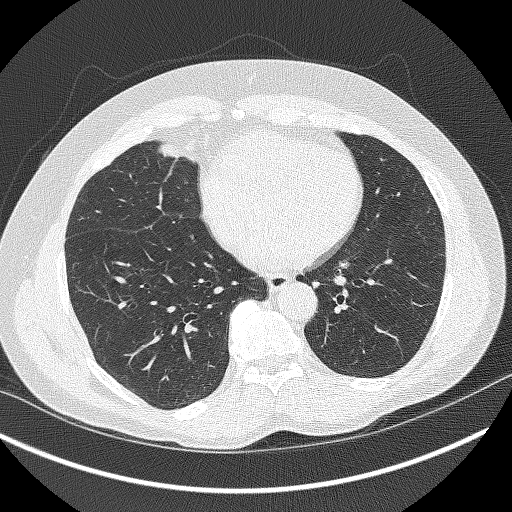
[im 191/350  lung]
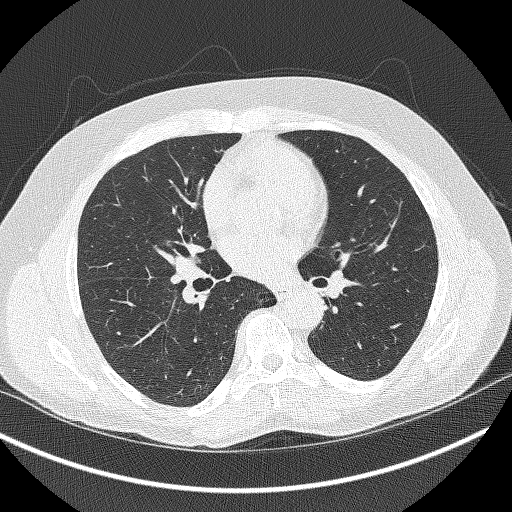
[im 223/350  lung]
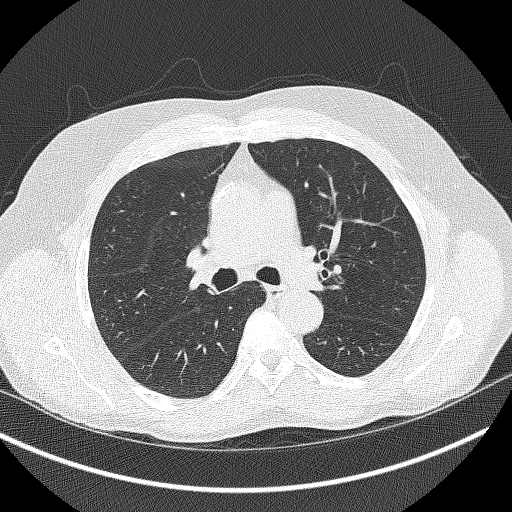
[im 238/350  mediastinal]
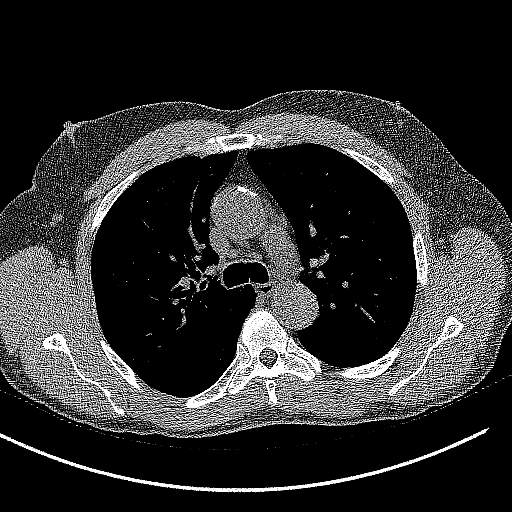
[im 238/350  lung]
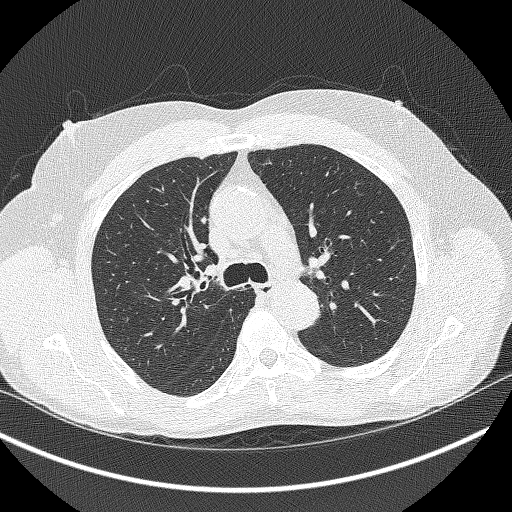
[im 270/350  lung]
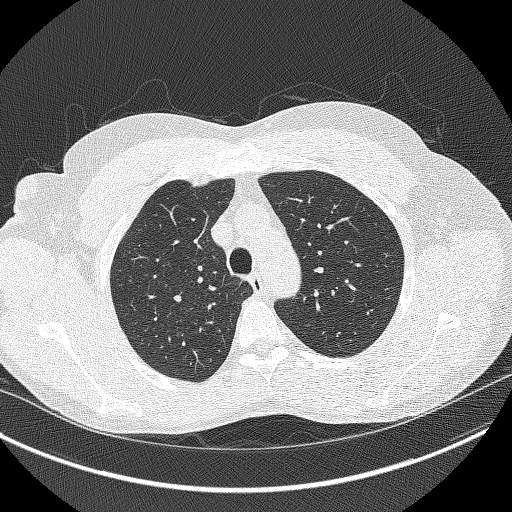
[im 302/350  lung]
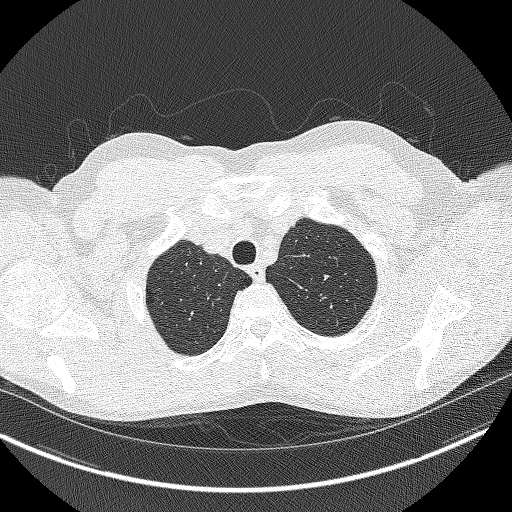
[im 334/350  lung]
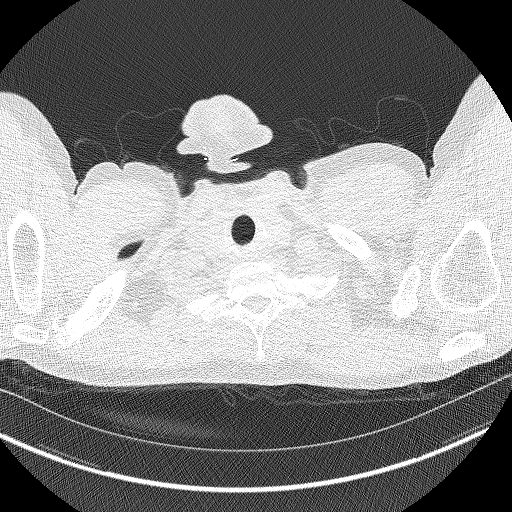

[Series 4: coronals lung 1.00 cor · coronal · 0.68mm/px · 3 of 324 slices shown]
[im 65/324  lung]
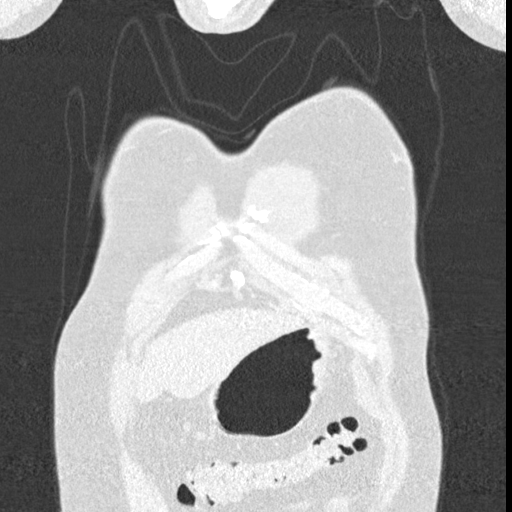
[im 130/324  lung]
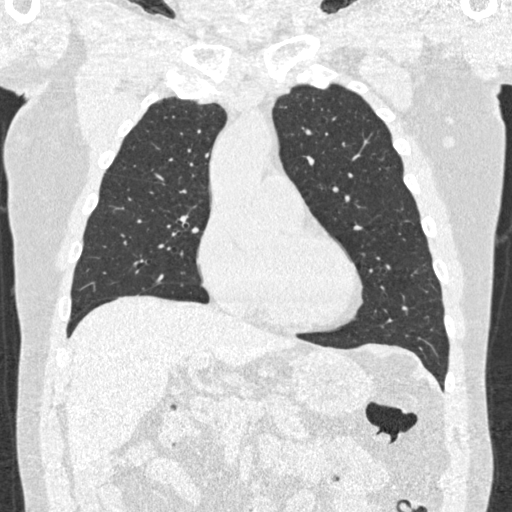
[im 194/324  lung]
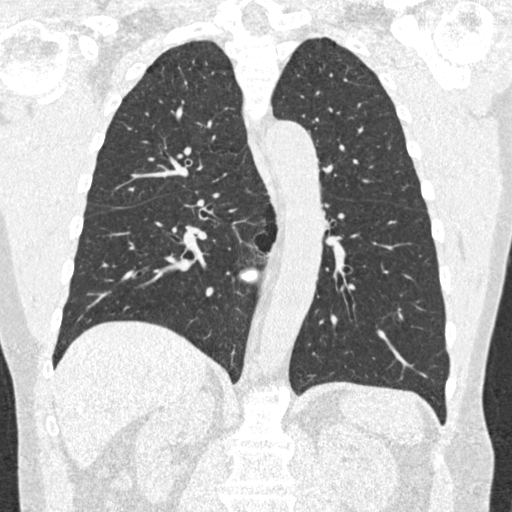

[15 of 40 positions shown; findings below may reference images not displayed]

FINDINGS: Cardiovascular: Heart size is normal. There is no significant
pericardial fluid, thickening or pericardial calcification. There is
aortic atherosclerosis, as well as atherosclerosis of the great
vessels of the mediastinum and the coronary arteries, including
calcified atherosclerotic plaque in the left main, left anterior
descending and left circumflex coronary arteries.

Mediastinum/Nodes: No pathologically enlarged mediastinal or hilar
lymph nodes. Esophagus is unremarkable in appearance. No axillary
lymphadenopathy.

Lungs/Pleura: No suspicious appearing pulmonary nodules or masses
are noted. No acute consolidative airspace disease. No pleural
effusions. Diffuse bronchial wall thickening with mild centrilobular
and paraseptal emphysema.

Upper Abdomen: Aortic atherosclerosis. Low-attenuation lesions in
both adrenal glands compatible with adrenal adenomas measuring 1.3 x
0.8 cm on the right and 3.3 x 2.2 cm on the left.

Musculoskeletal: There are no aggressive appearing lytic or blastic
lesions noted in the visualized portions of the skeleton.
IMPRESSION: 1. Lung-RADS 1S, negative. Continue annual screening with low-dose
chest CT without contrast in 12 months.
2. The "S" modifier above refers to potentially clinically
significant non lung cancer related findings. Specifically, there is
aortic atherosclerosis, in addition to left main and 2 vessel
coronary artery disease. Please note that although the presence of
coronary artery calcium documents the presence of coronary artery
disease, the severity of this disease and any potential stenosis
cannot be assessed on this non-gated CT examination. Assessment for
potential risk factor modification, dietary therapy or pharmacologic
therapy may be warranted, if clinically indicated.
3. Mild diffuse bronchial wall thickening with mild centrilobular
and paraseptal emphysema; imaging findings suggestive of underlying
COPD.
4. Bilateral (left greater than right) adrenal adenomas.

Aortic Atherosclerosis (APR06-EVO.O) and Emphysema (APR06-XN9.I).

## 2021-09-29 ENCOUNTER — Other Ambulatory Visit: Payer: Self-pay | Admitting: Family Medicine

## 2021-09-29 DIAGNOSIS — Z8619 Personal history of other infectious and parasitic diseases: Secondary | ICD-10-CM

## 2021-10-08 ENCOUNTER — Ambulatory Visit: Payer: Medicare Other

## 2021-10-15 ENCOUNTER — Ambulatory Visit
Admission: RE | Admit: 2021-10-15 | Discharge: 2021-10-15 | Disposition: A | Discharge status: Home / Self Care | Payer: Medicare Other | Source: Ambulatory Visit | Attending: Family Medicine | Admitting: Family Medicine

## 2021-10-15 DIAGNOSIS — Z8619 Personal history of other infectious and parasitic diseases: Secondary | ICD-10-CM | POA: Insufficient documentation

## 2022-01-28 ENCOUNTER — Encounter: Payer: Self-pay | Admitting: *Deleted

## 2022-01-28 ENCOUNTER — Telehealth: Payer: Self-pay | Admitting: *Deleted

## 2022-01-28 NOTE — Telephone Encounter (Signed)
Attempted to call x 3. No answer and 1 hang up to schedule follow up LCS CT scan. Will mail letter.

## 2022-06-28 ENCOUNTER — Encounter: Payer: Self-pay | Admitting: *Deleted

## 2023-03-01 ENCOUNTER — Other Ambulatory Visit: Payer: Self-pay | Admitting: Nurse Practitioner

## 2023-03-01 DIAGNOSIS — F1011 Alcohol abuse, in remission: Secondary | ICD-10-CM

## 2023-03-01 DIAGNOSIS — Z8619 Personal history of other infectious and parasitic diseases: Secondary | ICD-10-CM

## 2023-03-02 ENCOUNTER — Other Ambulatory Visit: Payer: Self-pay

## 2023-03-02 DIAGNOSIS — F17210 Nicotine dependence, cigarettes, uncomplicated: Secondary | ICD-10-CM

## 2023-03-02 DIAGNOSIS — Z122 Encounter for screening for malignant neoplasm of respiratory organs: Secondary | ICD-10-CM

## 2023-03-02 DIAGNOSIS — Z87891 Personal history of nicotine dependence: Secondary | ICD-10-CM

## 2023-03-22 ENCOUNTER — Ambulatory Visit: Admission: RE | Admit: 2023-03-22 | Payer: Medicare HMO | Source: Ambulatory Visit
